# Patient Record
Sex: Female | Born: 1940 | Race: Black or African American | Hispanic: No | Smoking: Never smoker
Health system: Southern US, Community
[De-identification: ages and names within clinical notes are randomized; demographics above are authoritative.]

## PROBLEM LIST (undated history)

## (undated) DIAGNOSIS — G4733 Obstructive sleep apnea (adult) (pediatric): Secondary | ICD-10-CM

## (undated) DIAGNOSIS — R441 Visual hallucinations: Secondary | ICD-10-CM

## (undated) DIAGNOSIS — E119 Type 2 diabetes mellitus without complications: Secondary | ICD-10-CM

## (undated) DIAGNOSIS — I10 Essential (primary) hypertension: Secondary | ICD-10-CM

## (undated) DIAGNOSIS — E7800 Pure hypercholesterolemia, unspecified: Secondary | ICD-10-CM

## (undated) DIAGNOSIS — M159 Polyosteoarthritis, unspecified: Secondary | ICD-10-CM

## (undated) DIAGNOSIS — R2689 Other abnormalities of gait and mobility: Secondary | ICD-10-CM

## (undated) DIAGNOSIS — R443 Hallucinations, unspecified: Secondary | ICD-10-CM

## (undated) DIAGNOSIS — E079 Disorder of thyroid, unspecified: Secondary | ICD-10-CM

## (undated) DIAGNOSIS — I509 Heart failure, unspecified: Secondary | ICD-10-CM

## (undated) DIAGNOSIS — E2839 Other primary ovarian failure: Secondary | ICD-10-CM

## (undated) DIAGNOSIS — R400 Somnolence: Secondary | ICD-10-CM

## (undated) HISTORY — DX: Other abnormalities of gait and mobility: R26.89

## (undated) HISTORY — DX: Obstructive sleep apnea (adult) (pediatric): G47.33

## (undated) HISTORY — PX: HERNIA REPAIR: SHX51

## (undated) HISTORY — DX: Visual hallucinations: R44.1

## (undated) HISTORY — DX: Heart failure, unspecified: I50.9

## (undated) HISTORY — PX: OTHER SURGICAL HISTORY: SHX169

## (undated) HISTORY — DX: Somnolence: R40.0

## (undated) HISTORY — DX: Other primary ovarian failure: E28.39

## (undated) HISTORY — DX: Pure hypercholesterolemia, unspecified: E78.00

## (undated) HISTORY — DX: Polyosteoarthritis, unspecified: M15.9

---

## 2012-10-22 ENCOUNTER — Emergency Department (HOSPITAL_COMMUNITY)
Admission: EM | Admit: 2012-10-22 | Discharge: 2012-10-22 | Disposition: A | Discharge status: Home / Self Care | Payer: Medicare Other | Attending: Emergency Medicine | Admitting: Emergency Medicine

## 2012-10-22 ENCOUNTER — Emergency Department (HOSPITAL_COMMUNITY): Payer: Medicare Other

## 2012-10-22 ENCOUNTER — Encounter (HOSPITAL_COMMUNITY): Payer: Self-pay | Admitting: *Deleted

## 2012-10-22 DIAGNOSIS — E669 Obesity, unspecified: Secondary | ICD-10-CM | POA: Insufficient documentation

## 2012-10-22 DIAGNOSIS — Z7982 Long term (current) use of aspirin: Secondary | ICD-10-CM | POA: Insufficient documentation

## 2012-10-22 DIAGNOSIS — R062 Wheezing: Secondary | ICD-10-CM | POA: Insufficient documentation

## 2012-10-22 DIAGNOSIS — I10 Essential (primary) hypertension: Secondary | ICD-10-CM | POA: Insufficient documentation

## 2012-10-22 DIAGNOSIS — Z79899 Other long term (current) drug therapy: Secondary | ICD-10-CM | POA: Insufficient documentation

## 2012-10-22 DIAGNOSIS — Z8639 Personal history of other endocrine, nutritional and metabolic disease: Secondary | ICD-10-CM | POA: Insufficient documentation

## 2012-10-22 DIAGNOSIS — R05 Cough: Secondary | ICD-10-CM | POA: Insufficient documentation

## 2012-10-22 DIAGNOSIS — Z862 Personal history of diseases of the blood and blood-forming organs and certain disorders involving the immune mechanism: Secondary | ICD-10-CM | POA: Insufficient documentation

## 2012-10-22 DIAGNOSIS — Z794 Long term (current) use of insulin: Secondary | ICD-10-CM | POA: Insufficient documentation

## 2012-10-22 DIAGNOSIS — E7800 Pure hypercholesterolemia, unspecified: Secondary | ICD-10-CM | POA: Insufficient documentation

## 2012-10-22 DIAGNOSIS — R059 Cough, unspecified: Secondary | ICD-10-CM | POA: Insufficient documentation

## 2012-10-22 DIAGNOSIS — J9801 Acute bronchospasm: Secondary | ICD-10-CM | POA: Insufficient documentation

## 2012-10-22 DIAGNOSIS — R0602 Shortness of breath: Secondary | ICD-10-CM | POA: Insufficient documentation

## 2012-10-22 DIAGNOSIS — E119 Type 2 diabetes mellitus without complications: Secondary | ICD-10-CM | POA: Insufficient documentation

## 2012-10-22 HISTORY — DX: Pure hypercholesterolemia, unspecified: E78.00

## 2012-10-22 HISTORY — DX: Disorder of thyroid, unspecified: E07.9

## 2012-10-22 HISTORY — DX: Essential (primary) hypertension: I10

## 2012-10-22 HISTORY — DX: Type 2 diabetes mellitus without complications: E11.9

## 2012-10-22 LAB — CBC
MCV: 78.8 fL (ref 78.0–100.0)
Platelets: 254 10*3/uL (ref 150–400)
RBC: 4.81 MIL/uL (ref 3.87–5.11)
RDW: 15 % (ref 11.5–15.5)
WBC: 7 10*3/uL (ref 4.0–10.5)

## 2012-10-22 LAB — BASIC METABOLIC PANEL
Chloride: 99 mEq/L (ref 96–112)
Creatinine, Ser: 0.79 mg/dL (ref 0.50–1.10)
GFR calc Af Amer: >90 mL/min (ref >90)
Potassium: 3.7 mEq/L (ref 3.5–5.1)
Sodium: 136 mEq/L (ref 135–145)

## 2012-10-22 LAB — POCT I-STAT TROPONIN I: Troponin i, poc: 0 ng/mL (ref 0.00–0.08)

## 2012-10-22 LAB — PRO B NATRIURETIC PEPTIDE: Pro B Natriuretic peptide (BNP): 92.7 pg/mL (ref 0–125)

## 2012-10-22 MED ORDER — ALBUTEROL SULFATE (5 MG/ML) 0.5% IN NEBU
5.0000 mg | INHALATION_SOLUTION | Freq: Once | RESPIRATORY_TRACT | Status: AC
  Administered 2012-10-22: 5 mg via RESPIRATORY_TRACT
  Filled 2012-10-22: qty 1

## 2012-10-22 MED ORDER — BENZONATATE 100 MG PO CAPS: 100.0000 mg | ORAL_CAPSULE | Freq: Three times a day (TID) | ORAL | Status: DC

## 2012-10-22 MED ORDER — ALBUTEROL SULFATE HFA 108 (90 BASE) MCG/ACT IN AERS
2.0000 | INHALATION_SPRAY | Freq: Once | RESPIRATORY_TRACT | Status: AC
  Administered 2012-10-22: 2 via RESPIRATORY_TRACT
  Filled 2012-10-22: qty 6.7

## 2012-10-22 NOTE — ED Notes (Signed)
MD at bedside. 

## 2012-10-22 NOTE — ED Provider Notes (Signed)
History     CSN: [id number]  Arrival date & time 10/22/12  1640   First MD Initiated Contact with Patient 10/22/12 1752      Chief Complaint  Patient presents with  . Cough    (Consider location/radiation/quality/duration/timing/severity/associated sxs/prior treatment) HPI Comments: 72 year old female with a past medical history of diabetes, hypertension, high cholesterol and thyroid disease presents daughter after being seen at [facility]. Patient went to urgent care complaining of a productive cough with yellow phlegm, wheezing and shortness of breath times one month. Nothing in specific changed today, however she is in town visiting her daughter and her daughter states this has been going on for long enough and she should be evaluated. Denies extremity edema, chest pain, fever, chills, nausea or vomiting. No history of heart failure. She sees her primary care physician on a regular basis and there've been no changes in her medications. At urgent care she was told on a chest x-ray that there was "fluid around her heart" and was advised to go to the emergency department.  Patient is a 72 y.o. female presenting with cough. The history is provided by the patient and a relative.  Cough Associated symptoms: shortness of breath and wheezing   Associated symptoms: no chest pain, no chills, no fever and no headaches     Past Medical History  Diagnosis Date  . Diabetes mellitus without complication   . Hypertension   . Thyroid disease   . High cholesterol     Past Surgical History  Procedure Laterality Date  . Hernia repair    . Gallstone      History reviewed. No pertinent family history.  History  Substance Use Topics  . Smoking status: Never Smoker   . Smokeless tobacco: Never Used  . Alcohol Use: No    OB History   Grav Para Term Preterm Abortions TAB SAB Ect Mult Living                  Review of Systems  Constitutional: Negative for fever and chills.   Respiratory: Positive for cough, shortness of breath and wheezing.   Cardiovascular: Negative for chest pain, palpitations and leg swelling.  Musculoskeletal: Negative for back pain.  Neurological: Negative for dizziness, weakness, light-headedness and headaches.  All other systems reviewed and are negative.    Allergies  Review of patient's allergies indicates no known allergies.  Home Medications   Current Outpatient Rx  Name  Route  Sig  Dispense  Refill  . aspirin 81 MG tablet   Oral   Take 81 mg by mouth daily.         . hydrochlorothiazide (MICROZIDE) 12.5 MG capsule   Oral   Take 12.5 mg by mouth daily.         Marland Kitchen ibuprofen (ADVIL,MOTRIN) 200 MG tablet   Oral   Take 200 mg by mouth every 6 (six) hours as needed for pain.         Marland Kitchen insulin glargine (LANTUS) 100 UNIT/ML injection   Subcutaneous   Inject into the skin at bedtime.         . metFORMIN (GLUMETZA) 1000 MG (MOD) 24 hr tablet   Oral   Take 1,000 mg by mouth daily with breakfast.         . pravastatin (PRAVACHOL) 40 MG tablet   Oral   Take 40 mg by mouth at bedtime.         . valsartan (DIOVAN) 160 MG  tablet   Oral   Take 160 mg by mouth daily.           BP 158/68  Pulse 71  Temp(Src) 98.4 F (36.9 C) (Oral)  Resp 20  SpO2 98%  Physical Exam  Nursing note and vitals reviewed. Constitutional: She is oriented to person, place, and time. She appears well-developed. No distress.  Obese  HENT:  Head: Normocephalic and atraumatic.  Mouth/Throat: Oropharynx is clear and moist.  Eyes: Conjunctivae are normal.  Neck: Normal range of motion. Neck supple.  Cardiovascular: Normal rate, regular rhythm, normal heart sounds and intact distal pulses.   Pulses:      Dorsalis pedis pulses are 2+ on the right side, and 2+ on the left side.       Posterior tibial pulses are 2+ on the right side, and 2+ on the left side.  Trace pitting edema lower extremities bilateral  Pulmonary/Chest: Effort  normal. No respiratory distress. She has no decreased breath sounds. She has no wheezes. She has no rhonchi. She has rales (posterior lung bases, mild).  Abdominal: Soft. Bowel sounds are normal. There is no tenderness.  Musculoskeletal: Normal range of motion. She exhibits no edema.  Neurological: She is alert and oriented to person, place, and time. She has normal strength. No cranial nerve deficit or sensory deficit. Gait normal.  Skin: Skin is warm and dry. She is not diaphoretic.  Psychiatric: She has a normal mood and affect. Her behavior is normal.    ED Course  Procedures (including critical care time)  Labs Reviewed  BASIC METABOLIC PANEL - Abnormal; Notable for the following:    Glucose, Bld 219 (*)    GFR calc non Af Amer 81 (*)    All other components within normal limits  CBC  PRO B NATRIURETIC PEPTIDE  POCT I-STAT TROPONIN I    Date: 10/22/2012  Rate: 65  Rhythm: sinus arrhythmia  QRS Axis: normal  Intervals: normal  ST/T Wave abnormalities: normal  Conduction Disutrbances:none  Narrative Interpretation: no stemi, aberrant complex, possibly supraventricular  Old EKG Reviewed: none available   Dg Chest 2 View  10/22/2012   *RADIOLOGY REPORT*  Clinical Data: Cough, hypertension  CHEST - 2 VIEW  Comparison: None.  Findings: Lung volumes are low with crowding of the bronchovascular markings.  Heart size is upper limits of normal.  No pleural effusion.  No acute osseous finding.  Mild leftward deviation of the trachea could reflect right thyroid nodule or goiter.  Left greater than right bilateral glenohumeral joint degenerative change identified. Cholecystectomy clips noted.  IMPRESSION: Extremely low lung volumes without focal acute finding. If the patient's symptoms continue, consider PA and lateral chest radiographs obtained at full inspiration when the patient is clinically able.   Original Report Authenticated By: Christiana Pellant, M.D.     1. Cough   2. Bronchospasm        MDM  72 year old female with a cough for one month with occasional shortness of breath and wheezing. She was advised to come to the ER with concerns of fluid around her heart. Her chest x-ray did not show any pulmonary edema. BNP within normal limits along with all the rest of her labs. Her EKG was normal. Symptoms resolved after having an albuterol breathing treatment. Repeat lung examination unremarkable. Patient doesn't have to history of GERD and is noncompliant with her medications. I discussed that she should probably start taking it again. Albuterol inhaler given along with for cough. Her vitals are  stable and she is in no apparent distress. O2 sat 98% on room air. Patient also evaluated by Dr. Juleen China feels as if patient is able to be discharged with follow up with her primary care physician. Return precautions discussed. Patient states understanding of plan and is agreeable.         Trevor Mace, PA-C 10/22/12 2020

## 2012-10-22 NOTE — ED Notes (Signed)
Pt states was sent here from [facility] d/t fluid build up around heart, states went there for cough and wheezing x 1 month, pt states sometimes has shortness of breath.

## 2012-10-23 NOTE — ED Provider Notes (Signed)
Medical screening examination/treatment/procedure(s) were conducted as a shared visit with non-physician practitioner(s) and myself.  I personally evaluated the patient during the encounter.  72 year old female with a cough for the past week. Etiology is not completely clear.  She had an x-ray at an outside facility which showed "fluid around her heart.". Imaging in the emergency room today distention or any evidence, although a suboptimal film. Her BNP is normal making heart failure extremely unlikely. She is in no respiratory distress on exam. Patient does have a history of GERD and is only intermittently taking her prescribed medication for this. Patient instructed the importance of taking this on a regular basis as reflux sometimes reason for chronic cough. We'll give her inhaler for possible symptomatic relief as well as antitussives. Does not seem to be consistent with asthma or postnasal drip. Return precautions discussed. Outpatient followup.  Raeford Razor, MD 10/23/12 1540

## 2015-05-06 ENCOUNTER — Emergency Department (HOSPITAL_COMMUNITY)
Admission: EM | Admit: 2015-05-06 | Discharge: 2015-05-06 | Disposition: A | Discharge status: Home / Self Care | Payer: Medicare Other | Attending: Emergency Medicine | Admitting: Emergency Medicine

## 2015-05-06 ENCOUNTER — Emergency Department (HOSPITAL_COMMUNITY): Payer: Medicare Other

## 2015-05-06 ENCOUNTER — Encounter (HOSPITAL_COMMUNITY): Payer: Self-pay | Admitting: Emergency Medicine

## 2015-05-06 DIAGNOSIS — Z7982 Long term (current) use of aspirin: Secondary | ICD-10-CM | POA: Diagnosis not present

## 2015-05-06 DIAGNOSIS — E119 Type 2 diabetes mellitus without complications: Secondary | ICD-10-CM | POA: Insufficient documentation

## 2015-05-06 DIAGNOSIS — M5442 Lumbago with sciatica, left side: Secondary | ICD-10-CM | POA: Diagnosis not present

## 2015-05-06 DIAGNOSIS — G8929 Other chronic pain: Secondary | ICD-10-CM | POA: Diagnosis not present

## 2015-05-06 DIAGNOSIS — I10 Essential (primary) hypertension: Secondary | ICD-10-CM | POA: Insufficient documentation

## 2015-05-06 DIAGNOSIS — Z7952 Long term (current) use of systemic steroids: Secondary | ICD-10-CM | POA: Diagnosis not present

## 2015-05-06 DIAGNOSIS — E7800 Pure hypercholesterolemia, unspecified: Secondary | ICD-10-CM | POA: Insufficient documentation

## 2015-05-06 DIAGNOSIS — R52 Pain, unspecified: Secondary | ICD-10-CM

## 2015-05-06 DIAGNOSIS — M545 Low back pain: Secondary | ICD-10-CM | POA: Diagnosis present

## 2015-05-06 DIAGNOSIS — Z791 Long term (current) use of non-steroidal anti-inflammatories (NSAID): Secondary | ICD-10-CM | POA: Diagnosis not present

## 2015-05-06 DIAGNOSIS — Z79899 Other long term (current) drug therapy: Secondary | ICD-10-CM | POA: Diagnosis not present

## 2015-05-06 MED ORDER — DEXAMETHASONE 4 MG PO TABS
10.0000 mg | ORAL_TABLET | Freq: Once | ORAL | Status: AC
  Administered 2015-05-06: 10 mg via ORAL
  Filled 2015-05-06: qty 2

## 2015-05-06 MED ORDER — ACETAMINOPHEN 500 MG PO TABS
1000.0000 mg | ORAL_TABLET | Freq: Once | ORAL | Status: AC
  Administered 2015-05-06: 1000 mg via ORAL
  Filled 2015-05-06: qty 2

## 2015-05-06 MED ORDER — DICLOFENAC SODIUM 75 MG PO TBEC
75.0000 mg | DELAYED_RELEASE_TABLET | Freq: Once | ORAL | Status: AC
  Administered 2015-05-06: 75 mg via ORAL
  Filled 2015-05-06: qty 1

## 2015-05-06 NOTE — ED Notes (Signed)
Patient transported to X-ray 

## 2015-05-06 NOTE — ED Provider Notes (Signed)
CSN: [id number]     Arrival date & time 05/06/15  1018 History   First MD Initiated Contact with Patient 05/06/15 1042     Chief Complaint  Patient presents with  . Sciatica     (Consider location/radiation/quality/duration/timing/severity/associated sxs/prior Treatment) Patient is a 74 y.o. female presenting with back pain. The history is provided by the patient.  Back Pain Location:  Lumbar spine Quality:  Stabbing and stiffness Radiates to:  L foot Pain severity:  Severe Pain is:  Same all the time Onset quality:  Sudden Duration:  2 days Timing:  Constant Progression:  Worsening Chronicity:  Chronic Relieved by:  Nothing Worsened by:  Twisting, touching and movement Ineffective treatments:  None tried Associated symptoms: no chest pain, no dysuria, no fever and no headaches    74 yo F with a chief complaints of left-sided lower back pain. Patient has chronic issue of this though is worsening over the past couple days. Patient's daughter recently had a child and she has been helping out with that. Pain worse with movement palpation. States that the pain goes down the back of her legs were feet. Denies loss of bowel or bladder. Denies injury. Denies fevers or chills.   Past Medical History  Diagnosis Date  . Diabetes mellitus without complication (HCC)   . Hypertension   . Thyroid disease   . High cholesterol    Past Surgical History  Procedure Laterality Date  . Hernia repair    . Gallstone     History reviewed. No pertinent family history. Social History  Substance Use Topics  . Smoking status: Never Smoker   . Smokeless tobacco: Never Used  . Alcohol Use: No   OB History    No data available     Review of Systems  Constitutional: Negative for fever and chills.  HENT: Negative for congestion and rhinorrhea.   Eyes: Negative for redness and visual disturbance.  Respiratory: Negative for shortness of breath and wheezing.   Cardiovascular: Negative for  chest pain and palpitations.  Gastrointestinal: Negative for nausea and vomiting.  Genitourinary: Negative for dysuria and urgency.  Musculoskeletal: Positive for back pain and arthralgias. Negative for myalgias.  Skin: Negative for pallor and wound.  Neurological: Negative for dizziness and headaches.      Allergies  Review of patient's allergies indicates no known allergies.  Home Medications   Prior to Admission medications   Medication Sig Start Date End Date Taking? Authorizing Provider  aspirin 81 MG tablet Take 81 mg by mouth daily.   Yes Historical Provider, MD  diclofenac (VOLTAREN) 75 MG EC tablet Take 75 mg by mouth 2 (two) times daily.   Yes Historical Provider, MD  diclofenac sodium (VOLTAREN) 1 % GEL Apply 2 g topically 4 (four) times daily.   Yes Historical Provider, MD  dorzolamide-timolol (COSOPT) 22.3-6.8 MG/ML ophthalmic solution Place 1 drop into both eyes 2 (two) times daily. 04/25/15  Yes Historical Provider, MD  furosemide (LASIX) 40 MG tablet Take 1 tablet by mouth daily as needed for fluid.  03/14/15  Yes Historical Provider, MD  hydrochlorothiazide (MICROZIDE) 12.5 MG capsule Take 12.5 mg by mouth daily.   Yes Historical Provider, MD  ibuprofen (ADVIL,MOTRIN) 200 MG tablet Take 200 mg by mouth every 6 (six) hours as needed for pain.   Yes Historical Provider, MD  LEVEMIR FLEXTOUCH 100 UNIT/ML Pen Inject 40 Units into the skin 2 (two) times daily. 03/18/15  Yes Historical Provider, MD  losartan (COZAAR) 50 MG tablet  Take 1 tablet by mouth daily. 02/26/15  Yes Historical Provider, MD  LUMIGAN 0.01 % SOLN Place 1 drop into both eyes daily. 04/25/15  Yes Historical Provider, MD  metFORMIN (GLUMETZA) 1000 MG (MOD) 24 hr tablet Take 1,000 mg by mouth 2 (two) times daily.    Yes Historical Provider, MD  metoprolol (LOPRESSOR) 100 MG tablet Take 100 mg by mouth 2 (two) times daily.   Yes Historical Provider, MD  Multiple Vitamin (MULTIVITAMIN WITH MINERALS) TABS tablet  Take 1 tablet by mouth daily.   Yes Historical Provider, MD  pravastatin (PRAVACHOL) 40 MG tablet Take 40 mg by mouth at bedtime.   Yes Historical Provider, MD  triamcinolone ointment (KENALOG) 0.1 % Apply 1 application topically 3 (three) times daily. 02/23/15  Yes Historical Provider, MD  benzonatate (TESSALON) 100 MG capsule Take 1 capsule (100 mg total) by mouth every 8 (eight) hours. Patient not taking: Reported on 05/06/2015 10/22/12   Nada Boozer Hess, PA-C   BP 146/57 mmHg  Pulse 59  Temp(Src) 97.7 F (36.5 C) (Oral)  Resp 18  SpO2 98% Physical Exam  Constitutional: She is oriented to person, place, and time. She appears well-developed and well-nourished. No distress.  HENT:  Head: Normocephalic and atraumatic.  Eyes: EOM are normal. Pupils are equal, round, and reactive to light.  Neck: Normal range of motion. Neck supple.  Cardiovascular: Normal rate and regular rhythm.  Exam reveals no gallop and no friction rub.   No murmur heard. Pulmonary/Chest: Effort normal. She has no wheezes. She has no rales.  Abdominal: Soft. She exhibits no distension. There is no tenderness. There is no rebound and no guarding.  Musculoskeletal: She exhibits tenderness (mild tenderness about the lower L-spine. Negative slr test. ). She exhibits no edema.  Neurological: She is alert and oriented to person, place, and time.  Skin: Skin is warm and dry. She is not diaphoretic.  Psychiatric: She has a normal mood and affect. Her behavior is normal.  Nursing note and vitals reviewed.   ED Course  Procedures (including critical care time) Labs Review Labs Reviewed - No data to display  Imaging Review Dg Lumbar Spine Complete  05/06/2015  CLINICAL DATA:  Left lower extremity radiculitis.  Sciatica. EXAM: LUMBAR SPINE - COMPLETE 4+ VIEW COMPARISON:  None. FINDINGS: Five non rib-bearing lumbar type vertebral bodies are present. Endplate degenerative change and spurring is present at L4-5 and L5-S1. Slight  degenerative anterolisthesis is present at L4-5. Advanced facet degenerative changes are present at L3-4 and particularly at L4-5 and L5-S1. Vertebral body heights are maintained. 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 IMPRESSION: 1. 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 2. No acute abnormality. 3. Atherosclerosis. Electronically Signed   By: Marin Roberts M.D.   On: 05/06/2015 12:08   I have personally reviewed and evaluated these images and lab results as part of my medical decision-making.   EKG Interpretation None      MDM   Final diagnoses:  Chronic left-sided low back pain with left-sided sciatica    74 yo F with a chief complaint of left-sided low back pain. Is a chronic problem for this patient. X-ray performed due to patient's age. Unremarkable. We'll discharge the patient home. NSAIDs for pain.  3:48 PM:  I have discussed the diagnosis/risks/treatment options with the patient and believe the pt to be eligible for discharge home to follow-up with PCP. We also discussed returning  to the ED immediately if new or worsening sx occur. We discussed the sx which are most concerning (e.g., sudden worsening pain, fever, cauda equina) that necessitate immediate return. Medications administered to the patient during their visit and any new prescriptions provided to the patient are listed below.  Medications given during this visit Medications  acetaminophen (TYLENOL) tablet 1,000 mg (1,000 mg Oral Given 05/06/15 1128)  diclofenac (VOLTAREN) EC tablet 75 mg (75 mg Oral Given 05/06/15 1128)  dexamethasone (DECADRON) tablet 10 mg (10 mg Oral Given 05/06/15 1241)    Discharge Medication List as of 05/06/2015 12:28 PM      The patient appears reasonably screen and/or stabilized for discharge and I doubt any other medical condition or other [facility] requiring further screening, evaluation, or  treatment in the ED at this time prior to discharge.      Melene Planan Lexander Tremblay, DO 05/06/15 [phone]

## 2015-05-06 NOTE — Discharge Instructions (Signed)
Follow-up with your doctor. Return for sudden worsening pain difficulty controlling her bladder or bowels or weakness of your legs. Chronic Back Pain  When back pain lasts longer than 3 months, it is called chronic back pain.People with chronic back pain often go through certain periods that are more intense (flare-ups).  CAUSES Chronic back pain can be caused by wear and tear (degeneration) on different structures in your back. These structures include:  The bones of your spine (vertebrae) and the joints surrounding your spinal cord and nerve roots (facets).  The strong, fibrous tissues that connect your vertebrae (ligaments). Degeneration of these structures may result in pressure on your nerves. This can lead to constant pain. HOME CARE INSTRUCTIONS  Avoid bending, heavy lifting, prolonged sitting, and activities which make the problem worse.  Take brief periods of rest throughout the day to reduce your pain. Lying down or standing usually is better than sitting while you are resting.  Take over-the-counter or prescription medicines only as directed by your caregiver. SEEK IMMEDIATE MEDICAL CARE IF:   You have weakness or numbness in one of your legs or feet.  You have trouble controlling your bladder or bowels.  You have nausea, vomiting, abdominal pain, shortness of breath, or fainting.   This information is not intended to replace advice given to you by your health care provider. Make sure you discuss any questions you have with your health care provider.   Document Released: 06/17/2004 Document Revised: 08/02/2011 Document Reviewed: 10/28/2014 Elsevier Interactive Patient Education [employer]2016 [employer]

## 2015-05-06 NOTE — ED Notes (Addendum)
Pt c/o sciatic pain in left hip/leg. Has been diagnosed with this and takes pain medication and gels which have not been working. Says she has taken Percocet in the past and it "works a little." Ambulatory with slow gait. PCP told patient she needed to have "the shot (unsure of name)." No other c/c. No hx DVT.

## 2019-07-24 DIAGNOSIS — G8929 Other chronic pain: Secondary | ICD-10-CM

## 2019-07-24 DIAGNOSIS — I10 Essential (primary) hypertension: Secondary | ICD-10-CM

## 2019-07-24 DIAGNOSIS — D649 Anemia, unspecified: Secondary | ICD-10-CM | POA: Insufficient documentation

## 2019-07-24 DIAGNOSIS — R0602 Shortness of breath: Secondary | ICD-10-CM

## 2019-07-24 HISTORY — DX: Essential (primary) hypertension: I10

## 2019-07-24 HISTORY — DX: Other chronic pain: G89.29

## 2019-07-24 HISTORY — DX: Shortness of breath: R06.02

## 2019-07-24 HISTORY — DX: Anemia, unspecified: D64.9

## 2019-11-13 ENCOUNTER — Encounter (HOSPITAL_COMMUNITY): Payer: Self-pay | Admitting: Emergency Medicine

## 2019-11-13 ENCOUNTER — Emergency Department (HOSPITAL_COMMUNITY)
Admission: EM | Admit: 2019-11-13 | Discharge: 2019-11-15 | Disposition: A | Discharge status: Home / Self Care | Payer: Medicare Other | Attending: Emergency Medicine | Admitting: Emergency Medicine

## 2019-11-13 ENCOUNTER — Other Ambulatory Visit: Payer: Self-pay

## 2019-11-13 DIAGNOSIS — R441 Visual hallucinations: Secondary | ICD-10-CM | POA: Insufficient documentation

## 2019-11-13 DIAGNOSIS — F22 Delusional disorders: Secondary | ICD-10-CM | POA: Diagnosis not present

## 2019-11-13 DIAGNOSIS — R440 Auditory hallucinations: Secondary | ICD-10-CM | POA: Insufficient documentation

## 2019-11-13 LAB — RAPID URINE DRUG SCREEN, HOSP PERFORMED
Amphetamines: NOT DETECTED
Barbiturates: NOT DETECTED
Benzodiazepines: NOT DETECTED
Cocaine: NOT DETECTED
Opiates: NOT DETECTED
Tetrahydrocannabinol: NOT DETECTED

## 2019-11-13 LAB — COMPREHENSIVE METABOLIC PANEL
ALT: 10 U/L (ref 0–44)
AST: 17 U/L (ref 15–41)
Albumin: 3.3 g/dL — ABNORMAL LOW (ref 3.5–5.0)
Alkaline Phosphatase: 96 U/L (ref 38–126)
Anion gap: 12 (ref 5–15)
BUN: 15 mg/dL (ref 8–23)
CO2: 23 mmol/L (ref 22–32)
Calcium: 9.3 mg/dL (ref 8.9–10.3)
Chloride: 105 mmol/L (ref 98–111)
Creatinine, Ser: 1.45 mg/dL — ABNORMAL HIGH (ref 0.44–1.00)
GFR calc Af Amer: 40 mL/min — ABNORMAL LOW (ref >60)
GFR calc non Af Amer: 34 mL/min — ABNORMAL LOW (ref >60)
Glucose, Bld: 138 mg/dL — ABNORMAL HIGH (ref 70–99)
Potassium: 3.5 mmol/L (ref 3.5–5.1)
Sodium: 140 mmol/L (ref 135–145)
Total Bilirubin: 0.6 mg/dL (ref 0.3–1.2)
Total Protein: 7.1 g/dL (ref 6.5–8.1)

## 2019-11-13 LAB — CBC
HCT: 36.9 % (ref 36.0–46.0)
Hemoglobin: 11.5 g/dL — ABNORMAL LOW (ref 12.0–15.0)
MCH: 25.5 pg — ABNORMAL LOW (ref 26.0–34.0)
MCHC: 31.2 g/dL (ref 30.0–36.0)
MCV: 81.8 fL (ref 80.0–100.0)
Platelets: 221 10*3/uL (ref 150–400)
RBC: 4.51 MIL/uL (ref 3.87–5.11)
RDW: 18.4 % — ABNORMAL HIGH (ref 11.5–15.5)
WBC: 5.6 10*3/uL (ref 4.0–10.5)
nRBC: 0 % (ref 0.0–0.2)

## 2019-11-13 LAB — URINALYSIS, ROUTINE W REFLEX MICROSCOPIC
Bilirubin Urine: NEGATIVE
Glucose, UA: NEGATIVE mg/dL
Hgb urine dipstick: NEGATIVE
Ketones, ur: NEGATIVE mg/dL
Leukocytes,Ua: NEGATIVE
Nitrite: NEGATIVE
Protein, ur: NEGATIVE mg/dL
Specific Gravity, Urine: 1.01 (ref 1.005–1.030)
pH: 5 (ref 5.0–8.0)

## 2019-11-13 NOTE — ED Triage Notes (Signed)
Patient arrives to ED by her daughter with complaints of visual hallucinations over the past few months. Patient states lately she has been seeing more and more. (kids, snakes, dead people who arent there). Denies SI/HI and denies hearing voices. States she just wants to be medicated because her PCP has not helped her.

## 2019-11-13 NOTE — ED Notes (Signed)
Christy Hanson NT informed this RN that pt was refusing to change into purple scrubs. Pt stated that wearing the scrubs meant she was staying the night and she did not want to.

## 2019-11-13 NOTE — ED Provider Notes (Signed)
[facility] EMERGENCY DEPARTMENT Provider Note   CSN: [id number] Arrival date & time: 11/13/19  1434     History Chief Complaint  Patient presents with  . Hallucinations  . Paranoid    Christy Hanson is a 79 y.o. female with history of diabetes mellitus, hypertension, hyperlipidemia, thyroid disease presents for evaluation of acute onset, progressively worsening visual hallucinations.  She reports that she has been experiencing hallucinations for "years" but they have significantly worsened more recently.  She states that she will typically see snakes or bugs/worms but now she is seeing people and animals.  She states that these hallucinations cause her great discomfort.  She also has occasional auditory hallucinations, hearing the ground crack.  She denies homicidal or suicidal ideation.  Denies recreational drug use or excessive alcohol intake.  Patient's daughter is at the bedside and provides supplemental history.  She states that the patient has been living with her for some time but 2 weeks ago moved into her own apartment.  The daughter notes that the hallucinations significantly worsened after that time and where "previously she knew the hallucinations were not real now she thinks they are real".  She notes the patient has been more anxious and she is afraid the patient has not been getting as much sleep.  She states that the other day the patient locked herself out of her apartment after thinking that she saw a snake.  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  She is primarily interested in starting medication to stop the hallucinations because they are causing her a great deal of distress.  The history is provided by the patient.       Past Medical History:  Diagnosis Date  . Diabetes mellitus without complication (HCC)   . High cholesterol    . Hypertension   . Thyroid disease     There are no problems to display for this patient.   Past Surgical History:  Procedure Laterality Date  . gallstone    . HERNIA REPAIR       OB History   No obstetric history on file.     History reviewed. No pertinent family history.  Social History   Tobacco Use  . Smoking status: Never Smoker  . Smokeless tobacco: Never Used  Substance Use Topics  . Alcohol use: No  . Drug use: No    Home Medications Prior to Admission medications   Medication Sig Start Date End Date Taking? Authorizing Provider  aspirin 81 MG tablet Take 81 mg by mouth daily.    [provider]  benzonatate (TESSALON) 100 MG capsule Take 1 capsule (100 mg total) by mouth every 8 (eight) hours. Patient not taking: Reported on 05/06/2015 10/22/12   Hess, Nada Boozer, PA-C  diclofenac (VOLTAREN) 75 MG EC tablet Take 75 mg by mouth 2 (two) times daily.    [provider]  diclofenac sodium (VOLTAREN) 1 % GEL Apply 2 g topically 4 (four) times daily.    [provider]  dorzolamide-timolol (COSOPT) 22.3-6.8 MG/ML ophthalmic solution Place 1 drop into both eyes 2 (two) times daily. 04/25/15   [provider]  furosemide (LASIX) 40 MG tablet Take 1 tablet by mouth daily as needed for fluid.  03/14/15   [provider]  hydrochlorothiazide (MICROZIDE) 12.5 MG capsule Take 12.5 mg by mouth daily.  [provider]  ibuprofen (ADVIL,MOTRIN) 200 MG tablet Take 200 mg by mouth every 6 (six) hours as needed for pain.    [provider]  LEVEMIR FLEXTOUCH 100 UNIT/ML Pen Inject 40 Units into the skin 2 (two) times daily. 03/18/15   [provider]  losartan (COZAAR) 50 MG tablet Take 1 tablet by mouth daily. 02/26/15   [provider]  LUMIGAN 0.01 % SOLN Place 1 drop into both eyes daily. 04/25/15   [provider]  metFORMIN (GLUMETZA) 1000 MG (MOD) 24 hr tablet Take 1,000 mg by mouth 2 (two)  times daily.     [provider]  metoprolol (LOPRESSOR) 100 MG tablet Take 100 mg by mouth 2 (two) times daily.    [provider]  Multiple Vitamin (MULTIVITAMIN WITH MINERALS) TABS tablet Take 1 tablet by mouth daily.    [provider]  pravastatin (PRAVACHOL) 40 MG tablet Take 40 mg by mouth at bedtime.    [provider]  triamcinolone ointment (KENALOG) 0.1 % Apply 1 application topically 3 (three) times daily. 02/23/15   [provider]    Allergies    Patient has no known allergies.  Review of Systems   Review of Systems  Constitutional: Negative for chills and fever.  Eyes: Negative for visual disturbance.  Respiratory: Negative for shortness of breath.   Cardiovascular: Negative for chest pain.  Gastrointestinal: Negative for abdominal pain, nausea and vomiting.  Neurological: Negative for headaches.  Psychiatric/Behavioral: Positive for hallucinations. The patient is nervous/anxious.   All other systems reviewed and are negative.   Physical Exam Updated Vital Signs BP 139/83 (BP Location: Left Arm)   Pulse 91   Temp 98.5 F (36.9 C) (Oral)   Resp 18   Ht 5\' 2"  (1.575 m)   Wt 96.6 kg   SpO2 100%   BMI 38.96 kg/m   Physical Exam Vitals and nursing note reviewed.  Constitutional:      General: She is not in acute distress.    Appearance: She is well-developed.  HENT:     Head: Normocephalic and atraumatic.  Eyes:     General:        Right eye: No discharge.        Left eye: No discharge.     Conjunctiva/sclera: Conjunctivae normal.  Neck:     Vascular: No JVD.     Trachea: No tracheal deviation.  Cardiovascular:     Rate and Rhythm: Normal rate and regular rhythm.  Pulmonary:     Effort: Pulmonary effort is normal.     Breath sounds: Normal breath sounds.  Abdominal:     General: There is no distension.     Palpations: Abdomen is soft.  Musculoskeletal:     Cervical back: Neck supple.  Skin:    Findings:  No erythema.  Neurological:     Mental Status: She is alert.  Psychiatric:        Attention and Perception: Attention normal. She perceives auditory and visual hallucinations.        Mood and Affect: Mood normal.        Speech: Speech normal.        Behavior: Behavior is cooperative.        Thought Content: Thought content does not include homicidal or suicidal ideation. Thought content does not include homicidal or suicidal plan.     ED Results / Procedures / Treatments   Labs (all labs ordered are listed, but only abnormal results  are displayed) Labs Reviewed  COMPREHENSIVE METABOLIC PANEL - Abnormal; Notable for the following components:      Result Value   Glucose, Bld 138 (*)    Creatinine, Ser 1.45 (*)    Albumin 3.3 (*)    GFR calc non Af Amer 34 (*)    GFR calc Af Amer 40 (*)    All other components within normal limits  CBC - Abnormal; Notable for the following components:   Hemoglobin 11.5 (*)    MCH 25.5 (*)    RDW 18.4 (*)    All other components within normal limits  RAPID URINE DRUG SCREEN, HOSP PERFORMED  URINALYSIS, ROUTINE W REFLEX MICROSCOPIC    EKG None  Radiology No results found.  Procedures Procedures (including critical care time)  Medications Ordered in ED Medications - No data to display  ED Course  I have reviewed the triage vital signs and the nursing notes.  Pertinent labs & imaging results that were available during my care of the patient were reviewed by me and considered in my medical decision making (see chart for details).    MDM Rules/Calculators/A&P                          Patient presents for evaluation of visual hallucinations.  Also has auditory hallucinations but reports the visual hallucinations are very bothersome to her and cause her distress.  Daughter reports hallucinations are getting worse since moving out on her own.  When directly asked if she feels that the patient is unsafe in her home she states that she does  not feel that the patient is unsafe but is concerned about the worsening symptoms and potentially lack of sleep as well.  They are both open to the patient trying a medication for her symptoms.  She is afebrile, vital signs are stable.  She is nontoxic in appearance.  She does not appear to be responding to internal stimuli at this time.  She is calm and cooperative with my assessment.  Physical examination is reassuring and screening labs reviewed and interpreted by myself show no leukocytosis, mild anemia, mildly elevated creatinine but BUN within normal limits.  UA is not concerning for UTI.  She is medically cleared for TTS evaluation at this time.  Care signed out to default provider for follow-up of TTS recommendations.  Final Clinical Impression(s) / ED Diagnoses Final diagnoses:  Visual hallucinations  Auditory hallucination    Rx / DC Orders ED Discharge Orders    None       Debroah Baller 11/13/19 2321    Drenda Freeze, MD 11/13/19 [phone]

## 2019-11-13 NOTE — ED Notes (Signed)
Attempted to change the pt into purple scrubs, the pt refused and stated that she is not spending the night. RN notified

## 2019-11-13 NOTE — ED Notes (Signed)
Kennyth Lose, daughter, [phone] would like an update when available

## 2019-11-14 ENCOUNTER — Emergency Department (HOSPITAL_COMMUNITY): Payer: Medicare Other

## 2019-11-14 DIAGNOSIS — R441 Visual hallucinations: Secondary | ICD-10-CM | POA: Diagnosis not present

## 2019-11-14 MED ORDER — QUETIAPINE FUMARATE 50 MG PO TABS: 50.0000 mg | ORAL_TABLET | Freq: Every day | ORAL | Status: DC

## 2019-11-14 MED ORDER — QUETIAPINE FUMARATE 50 MG PO TABS
50.0000 mg | ORAL_TABLET | Freq: Every day | ORAL | 0 refills | Status: DC
Start: 2019-11-14 — End: 2020-01-25

## 2019-11-14 MED ORDER — METFORMIN HCL 500 MG PO TABS: 1000.0000 mg | ORAL_TABLET | Freq: Once | ORAL | Status: DC

## 2019-11-14 NOTE — ED Notes (Signed)
Pt's breakfast arrived 

## 2019-11-14 NOTE — ED Notes (Addendum)
Pt's dtr called and reports in 2019 pt had back surgery. She refused to do any therapy- she was scared of nursing home. She did go but then came home and just sat around. She does not like to be active or move at all. She has been staying with dtr during COVID. She took her back to [facility] in January due to pt's continued request. While there GI bleeding occurred and she went to hospital. Dtr stayed with her during this and they had already talked about moving. She grew concerned that mother would not actually move to [place]. She began to pack her up.  Before she moved, she had been seeing things all the time. SHe was very calm about it and was able to say what she was seeing. She would ask her dtr to come and ask her what is on fireplace. She also reports that she sees her dead husband but without a head. She sees his body parts like his hand. She would see snake there. Dtr said these behaviors were new to her. When she moved the behaviors continued per dtr.  Things came to a head when pt wanted dtr to call police bc she saw someone in her room with face covering on. She told pt she either had to come to hospital voluntarily and involuntarily but had to come to hospital to get help.  Dtr denies hx of substance abuse. Reports her memory seems fine. She is able to do ADLs so she has delayed seeking diagnosis for while.

## 2019-11-14 NOTE — ED Notes (Signed)
dtr to bring phone charger

## 2019-11-14 NOTE — ED Notes (Signed)
Patient transported to CT 

## 2019-11-14 NOTE — Progress Notes (Signed)
Reassessment: Patient seen. Chart reviewed. Christy Hanson is a 79 year old female with history of diabetes, HTN, HLD, and thyroid disease who presented to the ED due to visual hallucinations. She has no psychiatric history.  On assessment today, Christy Hanson seen sitting calmly in bed. She is alert and oriented x3. She appears euthymic and reports stable mood. She shows no signs of responding to internal stimuli. She reports she has been having visual hallucinations for about three years. 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   Patient reports VH have increased in intensity over the last two weeks since she moved into a new apartment. She called the police at one point due to seeing mice crawling out of the ceiling, but when police arrived there was nothing there. She sees animals as well as family members who are not there, including her deceased husband. She states VH were not bothersome initially, but now she feels afraid of them at times. She denies auditory hallucinations. She denies depressed mood or mood instability. She does admit to anxiety related to the increased visual hallucinations. She strongly denies any suicidal or self-harm thoughts. Denies HI. She does report problems with sleep and states that the visual hallucinations are most common right before she goes to sleep and when she awakens.  She denies history of hallucinations before the last three years. Denies history of mental health diagnoses or hospitalizations. No history of suicide attempts or aggression.  With patient's verbal consent, collateral information was obtained from her daughter Christy Hanson. [name] reports concern because the patient had always realized her VH were not real, but over the last two weeks patient seems to believe hallucinations are real at times and has become distressed as a result. She denies observing any  signs of memory loss in the patient. She denies any other psychiatric symptoms. She denies concerns that the patient is an acute risk of harm to self or others.  Disposition: Patient is not acutely psychotic and poses no acute risk of harm to self or others. She is psychiatrically cleared for discharge. ED RN notified. I have consulted with EDP about concern for potential medical cause of visual hallucinations, and CT scan of head is ordered. Will start Seroquel 50 mg QHS to assist with sleep problems and hallucinations. Consulting social work to set up outpatient follow-up.

## 2019-11-14 NOTE — Discharge Instructions (Signed)
Prescription sent to your pharmacy for seroquel. Take this medicine at bedtime.    Your kidney function was slightly elevated today.  Your creatinine was 1.45.  You should have this rechecked in 1 week by your primary care doctor.  In the meantime please increase your water intake.    Follow-up with neurology Dr. Laurence Slate.  Call his office to schedule next available appointment. His office number is included in your discharge papers.  Return to the emergency department for any new or worsening symptoms.

## 2019-11-14 NOTE — ED Provider Notes (Signed)
Emergency Medicine Observation Re-evaluation Note  Christy Hanson is a 79 y.o. female, seen on rounds today.  Pt initially presented to the ED for complaints of Hallucinations and Paranoid Currently, the patient is participating in TTS evaluation.  Physical Exam  BP (!) 146/65 (BP Location: Right Arm)   Pulse 96   Temp 97.8 F (36.6 C) (Oral)   Resp 17   Ht 5\' 2"  (1.575 m)   Wt 96.6 kg   SpO2 100%   BMI 38.96 kg/m  Physical Exam  PE: Constitutional: well-developed, well-nourished, no apparent distress HENT: atraumatic Pulmonary/Chest: effort normal; Abdominal: nondistended Musculoskeletal: no edema Neurological: alert  Skin: warm and dry, no rash, no diaphoresis Psychiatric: + auditory and visual hallucinations    ED Course / MDM  EKG:    I have reviewed the labs performed to date as well as medications administered while in observation.  Recent changes in the last 24 hours include head ct.  CT HEAD WITHOUT CONTRAST    TECHNIQUE:  Contiguous axial images were obtained from the base of the skull  through the vertex without intravenous contrast.    COMPARISON: None.    FINDINGS:  Brain: There is mild cerebral atrophy with widening of the  extra-axial spaces and ventricular dilatation.  There are areas of decreased attenuation within the white matter  tracts of the supratentorial brain, consistent with microvascular  disease changes.    Vascular: No hyperdense vessel or unexpected calcification.    Skull: Normal. Negative for fracture or focal lesion.    Sinuses/Orbits: No acute finding.    Other: None.    IMPRESSION:  1. Generalized cerebral atrophy.  2. No acute intracranial abnormality.      Electronically Signed  By: M.D.  On: 11/14/2019 17:37    Plan   Patient is not under full IVC at this time.  Patient had behavioral health evaluation yesterday and it was recommended that she have overnight observation with  reevaluation today.  11:03 AM TTS reassessment in process.  12:00 PM Discussed case with 11/16/2019 NP at behavioral health. She is requesting head CT to assess her hallucinations. If had CT is negative patient can be discharged home with outpatient neuro follow up which Marciano Sequin NP is setting up. She is asking for prescription for seroquil to be taken at bedtime to be prescribed for patient.  Head CT shows no acute intracranial abnormalities.  She does have generalized cerebral atrophy.  Discussed results with patient.  She is stable to be discharged home.  She will need to follow-up outpatient with neurology and has been given contact information.  Patient is agreeable with plan of care.  She feels safe being discharged home. Daughter will come pick up patient. Strict return precautions discussed.   Portions of this note were generated with Gerilyn Pilgrim. Dictation errors may occur despite best attempts at proofreading.     Scientist, clinical (histocompatibility and immunogenetics), PA-C 11/14/19 1811    11/16/19, MD 11/15/19 1350

## 2019-11-14 NOTE — ED Notes (Signed)
Pt's lunch ordered 

## 2019-11-14 NOTE — BH Assessment (Signed)
Comprehensive Clinical Assessment (CCA) Screening, Triage and Referral Note  11/14/2019 Rukaya Kleinschmidt [id number]    Pt presents to [facility] voluntarily for active audio and visual hallucinations. Per EDP report, "Ladawna Garn is a 79 y.o. female with history of diabetes mellitus, hypertension, hyperlipidemia, thyroid disease presents for evaluation of acute onset, progressively worsening visual hallucinations.  She reports that she has been experiencing hallucinations for "years" but they have significantly worsened more recently.  She states that she will typically see snakes or bugs/worms but now she is seeing people and animals.  She states that these hallucinations cause her great discomfort.  She also has occasional auditory hallucinations, hearing the ground crack.  She denies homicidal or suicidal ideation"  TTS: During assessment pt actively denies SI, HI, and SIB. Pt does admit to audio and visual hallucinations, she states that for the more than a year she has experienced AVH but it has gotten severe last 2 weeks since staying alone by herself. Pt states, " I have been seeing all types of things, snakes, rats, animals". Pt states she feels she has also seen someone in her home although no one appears to be there and hearing things as well. Pt reports she is living alone for the first time and moved out of daughters house and states she does feel a bit scared to be alone although she wants to be independent. Pt reports getting 5 hours of sleep with a fair appetite. Pt denies all symptoms of depression, no history of trauma/abuse. Pt does report increased paranoia, denies drug use past or present. Pt reports no access to weapons/violence. Pt does contract for safety at this time.    Collateral : Yesterday pt was excited and anxious, pt was hallucinating, saw someone, Locked herself out of house, thought she saw a snake in the house. The daughter notes that the hallucinations significantly  worsened after that time and where "previously she knew the hallucinations were not real now she thinks they are real".  She notes the patient has been more anxious and she is afraid the patient has not been getting as much sleep.  She states that the other day the patient locked herself out of her apartment after thinking that she saw a snake.  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 AVH became more frequent past 2 weeks in her own place. She states that pt has no psychiatric history, no SI attempts, no HI no SIB. She states pt has had increased paranoia with AVH. She states that pt does want to live alone and have independence and freedom despite AVH. She reports that pt does have sleep apnea, so very possible pt has not received the proper amount of sleep last few months.    Visit Diagnosis: Unspecified Psychosis  Disposition: Adaku, Anike, FNP recommends pt for overnight observation, reassess in the morning. Comprehensive Clinical Assessment (CCA) Note  11/14/2019 Tahj Ola [id number]  Visit Diagnosis:      ICD-10-CM   1. Visual hallucinations  R44.1   2. Auditory hallucination  R44.0       CCA Screening, Triage and Referral (STR)  Patient Reported Information How did you hear about [place]? Self  Referral name: self  Referral phone number: No data recorded  Whom do you see for routine medical problems? Primary Care  Practice/Facility Name: Dr Caleen Jobs [facility])  Practice/Facility  Phone Number: No data recorded Name of Contact: No data recorded Contact Number: No data recorded Contact Fax Number: No data recorded Prescriber Name: No data recorded Prescriber Address (if known): No data recorded  What Is the Reason for Your Visit/Call Today? No data recorded How Long Has This Been Causing You Problems? No data recorded What Do You  Feel Would Help You the Most Today? No data recorded  Have You Recently Been in Any Inpatient Treatment (Hospital/Detox/Crisis Center/28-Day Program)? No  Name/Location of Program/Hospital:No data recorded How Long Were You There? No data recorded When Were You Discharged? No data recorded  Have You Ever Received Services From [facility] Before? No data recorded Who Do You See at [facility]? No data recorded  Have You Recently Had Any Thoughts About Hurting Yourself? No  Are You Planning to Commit Suicide/Harm Yourself At This time? No   Have you Recently Had Thoughts About Hurting Someone Karolee Ohs? No  Explanation: No data recorded  Have You Used Any Alcohol or Drugs in the Past 24 Hours? No  How Long Ago Did You Use Drugs or Alcohol? No data recorded What Did You Use and How Much? No data recorded  Do You Currently Have a Therapist/Psychiatrist? No  Name of Therapist/Psychiatrist: No data recorded  Have You Been Recently Discharged From Any Office Practice or Programs? No  Explanation of Discharge From Practice/Program: No data recorded    CCA Screening Triage Referral Assessment Type of Contact: Tele-Assessment  Is this Initial or Reassessment? Initial Assessment  Date Telepsych consult ordered in [facility]:  11/13/19  Time Telepsych consult ordered in [facility]:  No data recorded  Patient Reported Information Reviewed? No data recorded Patient Left Without Being Seen? No data recorded Reason for Not Completing Assessment: No data recorded  Collateral Involvement: yes   Does Patient Have a Court Appointed Legal Guardian? No data recorded Name and Contact of Legal Guardian: No data recorded If Minor and Not Living with Parent(s), Who has Custody? No data recorded Is CPS involved or ever been involved? Never  Is APS involved or ever been involved? Never   Patient Determined To Be At Risk for Harm To Self or Others Based on Review of Patient Reported Information or  Presenting Complaint? No  Method: No data recorded Availability of Means: No data recorded Intent: No data recorded Notification Required: No data recorded Additional Information for Danger to Others Potential: No data recorded Additional Comments for Danger to Others Potential: No data recorded Are There Guns or Other Weapons in Your Home? No data recorded Types of Guns/Weapons: No data recorded Are These Weapons Safely Secured?                            No data recorded Who Could Verify You Are Able To Have These Secured: No data recorded Do You Have any Outstanding Charges, Pending Court Dates, Parole/Probation? No data recorded Contacted To Inform of Risk of Harm To Self or Others: No data recorded  Location of Assessment: [facility]   Does Patient Present under Involuntary Commitment? No  IVC Papers Initial File Date: No data recorded  [place] of Residence: [place]   Patient Currently Receiving the Following Services: Not Receiving Services   Determination of Need: No data recorded  Options For Referral: No data recorded    CCA Biopsychosocial  Intake/Chief Complaint:  CCA Intake With Chief Complaint Chief Complaint/Presenting Problem: audio/visual hallucinations Patient's Currently Reported Symptoms/Problems: NA  Individual's Strengths: NA Individual's Preferences: NA Individual's Abilities: NA Type of Services Patient Feels Are Needed: NA Initial Clinical Notes/Concerns: NA  Mental Health Symptoms Depression:  Depression: None  Mania:  Mania: None  Anxiety:   Anxiety: Worrying  Psychosis:  Psychosis: Hallucinations, Duration of symptoms less than six months  Trauma:  Trauma: None  Obsessions:  Obsessions: None  Compulsions:  Compulsions: None  Inattention:  Inattention: None  Hyperactivity/Impulsivity:  Hyperactivity/Impulsivity: N/A  Oppositional/Defiant Behaviors:  Oppositional/Defiant Behaviors: None  Emotional Irregularity:  Emotional Irregularity: None   Other Mood/Personality Symptoms:  Other Mood/Personality Symptoms: NA   Mental Status Exam Appearance and self-care  Stature:     Weight:     Clothing:  Clothing: Casual  Grooming:  Grooming: Normal  Cosmetic use:  Cosmetic Use: Age appropriate  Posture/gait:  Posture/Gait: Normal  Motor activity:  Motor Activity:  (Normal)  Sensorium  Attention:  Attention: Normal  Concentration:  Concentration: Normal  Orientation:  Orientation: Person, Place, Situation, Time  Recall/memory:     Affect and Mood  Affect:  Affect: Appropriate  Mood:  Mood: Other (Comment) (Pleasant)  Relating  Eye contact:  Eye Contact: Normal  Facial expression:     Attitude toward examiner:  Attitude Toward Examiner: Cooperative  Thought and Language  Speech flow: Speech Flow: Clear and Coherent  Thought content:  Thought Content: Appropriate to Mood and Circumstances  Preoccupation:     Hallucinations:  Hallucinations: Auditory, Visual  Organization:     Company secretaryxecutive Functions  Fund of Knowledge:  Fund of Knowledge: Good  Intelligence:  Intelligence: Average  Abstraction:  Abstraction: Functional  Judgement:  Judgement: Good  Reality Testing:     Insight:  Insight: Gaps, Good  Decision Making:     Social Functioning  Social Maturity:  Social Maturity: Responsible  Social Judgement:  Social Judgement: Normal  Stress  Stressors:  Stressors: Grief/losses, Other (Comment), Transitions  Coping Ability:     Skill Deficits:     Supports:  Supports: Other (Comment) (Daughter)     Religion: Religion/Spirituality How Might This Affect Treatment?: NA  Leisure/Recreation:    Exercise/Diet: Exercise/Diet Do You Have Any Trouble Sleeping?: Yes Explanation of Sleeping Difficulties: active hallucinations   CCA Employment/Education  Employment/Work Situation: Employment / Work Situation Employment situation: Retired Has patient ever been in the Eli Lilly and Companymilitary?: No  Education: Education Is Patient  Currently Attending School?: No Did Garment/textile technologistYou Graduate From [employer] School?: Yes Did Designer, television/film setYou Attend Graduate School?: Yes Did You Have Any Scientist, research (life sciences)pecial Interests In School?: none Did You Have An Individualized Education Program (IIEP): No Did You Have Any Difficulty At Progress EnergySchool?: No Patient's Education Has Been Impacted by Current Illness: No   CCA Family/Childhood History  Family and Relationship History: Family history What is your sexual orientation?: NA Has your sexual activity been affected by drugs, alcohol, medication, or emotional stress?: NA Does patient have children?: Yes How many children?: 1 How is patient's relationship with their children?: good  Childhood History:  Childhood History Additional childhood history information: NA Description of patient's relationship with caregiver when they were a child: NA Patient's description of current relationship with people who raised him/her: NA How were you disciplined when you got in trouble as a child/adolescent?: NA Did patient suffer any verbal/emotional/physical/sexual abuse as a child?: No Did patient suffer from severe childhood neglect?: No Has patient ever been sexually abused/assaulted/raped as an adolescent or adult?: No Was the patient ever a victim of a crime or a disaster?: No Witnessed domestic violence?: No  Has patient been affected by domestic violence as an adult?: No  Child/Adolescent Assessment:     CCA Substance Use  Alcohol/Drug Use: Alcohol / Drug Use Pain Medications: see MAR Prescriptions: see MAR Over the Counter: see MAR History of alcohol / drug use?: No history of alcohol / drug abuse                         ASAM's:  Six Dimensions of Multidimensional Assessment  Dimension 1:  Acute Intoxication and/or Withdrawal Potential:      Dimension 2:  Biomedical Conditions and Complications:      Dimension 3:  Emotional, Behavioral, or Cognitive Conditions and Complications:     Dimension 4:   Readiness to Change:     Dimension 5:  Relapse, Continued use, or Continued Problem Potential:     Dimension 6:  Recovery/Living Environment:     ASAM Severity Score:    ASAM Recommended Level of Treatment:     Substance use Disorder (SUD)    Recommendations for Services/Supports/Treatments:    DSM5 Diagnoses: There are no problems to display for this patient.    Referrals to Alternative Service(s): Referred to Alternative Service(s):   Place:   Date:   Time:    Referred to Alternative Service(s):   Place:   Date:   Time:    Referred to Alternative Service(s):   Place:   Date:   Time:    Referred to Alternative Service(s):   Place:   Date:   Time:     Donato Heinz   ICD-10-CM   1. Visual hallucinations  R44.1   2. Auditory hallucination  R44.0     Patient Reported Information How did you hear about [place]? Self   Referral name: self   Referral phone number: No data recorded Whom do you see for routine medical problems? Primary Care   Practice/Facility Name: Dr Rolla Flatten [facility])   Practice/Facility Phone Number: No data recorded  Name of Contact: No data recorded  Contact Number: No data recorded  Contact Fax Number: No data recorded  Prescriber Name: No data recorded  Prescriber Address (if known): No data recorded What Is the Reason for Your Visit/Call Today? No data recorded How Long Has This Been Causing You Problems? No data recorded Have You Recently Been in Any Inpatient Treatment (Hospital/Detox/Crisis Center/28-Day Program)? No   Name/Location of Program/Hospital:No data recorded  How Long Were You There? No data recorded  When Were You Discharged? No data recorded Have You Ever Received Services From [facility] Before? No data recorded  Who Do You See at [facility]? No data recorded Have You Recently Had Any Thoughts About Hurting Yourself? No   Are You Planning to Commit Suicide/Harm Yourself At This time?  No  Have you Recently  Had Thoughts About [place]? No   Explanation: No data recorded Have You Used Any Alcohol or Drugs in the Past 24 Hours? No   How Long Ago Did You Use Drugs or Alcohol?  No data recorded  What Did You Use and How Much? No data recorded What Do You Feel Would Help You the Most Today? No data recorded Do You Currently Have a Therapist/Psychiatrist? No   Name of Therapist/Psychiatrist: No data recorded  Have You Been Recently Discharged From Any Office Practice or Programs? No   Explanation of Discharge From Practice/Program:  No data recorded    CCA Screening Triage Referral Assessment  Type of Contact: Tele-Assessment   Is this Initial or Reassessment? Initial Assessment   Date Telepsych consult ordered in [facility]:  11/13/19   Time Telepsych consult ordered in [facility]:  No data recorded Patient Reported Information Reviewed? No data recorded  Patient Left Without Being Seen? No data recorded  Reason for Not Completing Assessment: No data recorded Collateral Involvement: yes  Does Patient Have a Court Appointed Legal Guardian? No data recorded  Name and Contact of Legal Guardian:  No data recorded If Minor and Not Living with Parent(s), Who has Custody? No data recorded Is CPS involved or ever been involved? Never  Is APS involved or ever been involved? Never  Patient Determined To Be At Risk for Harm To Self or Others Based on Review of Patient Reported Information or Presenting Complaint? No   Method: No data recorded  Availability of Means: No data recorded  Intent: No data recorded  Notification Required: No data recorded  Additional Information for Danger to Others Potential:  No data recorded  Additional Comments for Danger to Others Potential:  No data recorded  Are There Guns or Other Weapons in Your Home?  No data recorded   Types of Guns/Weapons: No data recorded   Are These Weapons Safely Secured?                              No data recorded   Who Could  Verify You Are Able To Have These Secured:    No data recorded Do You Have any Outstanding Charges, Pending Court Dates, Parole/Probation? No data recorded Contacted To Inform of Risk of Harm To Self or Others: No data recorded Location of Assessment: [facility] ED  Does Patient Present under Involuntary Commitment? No   IVC Papers Initial File Date: No data recorded  [place] of Residence: [place]  Patient Currently Receiving the Following Services: Not Receiving Services   Determination of Need: No data recorded  Options For Referral: No data recorded  Natasha Mead, LCSWA

## 2019-11-14 NOTE — ED Notes (Signed)
Pt given washcloths, soap, lotion, toothpaste, and toothbrush.

## 2019-11-14 NOTE — ED Notes (Signed)
Pt reports she was recently moved here by her dtr from [facility]. 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 When she was discharged home, her home was packed up. She states some of her stuff was thrown away and she really does like to shop- that's "her thing". She reports she has been seeing things that are not there for quite some time but no one offered her meds for it. She reports that she would like help and would like to remember things well. She understands the brain changes as you age.  RN explained to her that there is a process and she will need to change into scrubs. She requested washcloth to "freshen up".  She is currently changing now.

## 2019-11-14 NOTE — ED Notes (Signed)
Pt observed sleeping sitting up, appears she is going to fall over.  RN was able to wake her and convince her to lie down in the bed and rest.  Pt given warm blanket.  Calm and cooperative.  Pt asked for food however was asleep prior to RN returning.

## 2019-11-14 NOTE — ED Notes (Addendum)
Pt waiting for CT.  Ct called to ask if pt in line. She is

## 2019-11-14 NOTE — ED Notes (Signed)
Per Pt's permission, RN called and spoke to daughter to give update.  Would like [facility] to call and get information after assessment.

## 2019-11-14 NOTE — ED Notes (Signed)
TTS occuring

## 2020-01-18 ENCOUNTER — Inpatient Hospital Stay (HOSPITAL_COMMUNITY)
Admission: EM | Admit: 2020-01-18 | Discharge: 2020-01-25 | DRG: 177 | Disposition: A | Discharge status: Home Health | Payer: Medicare Other | Attending: Internal Medicine | Admitting: Internal Medicine

## 2020-01-18 ENCOUNTER — Emergency Department (HOSPITAL_COMMUNITY): Payer: Medicare Other

## 2020-01-18 ENCOUNTER — Encounter (HOSPITAL_COMMUNITY): Payer: Self-pay

## 2020-01-18 ENCOUNTER — Other Ambulatory Visit: Payer: Self-pay

## 2020-01-18 DIAGNOSIS — I130 Hypertensive heart and chronic kidney disease with heart failure and stage 1 through stage 4 chronic kidney disease, or unspecified chronic kidney disease: Secondary | ICD-10-CM | POA: Diagnosis present

## 2020-01-18 DIAGNOSIS — U071 COVID-19: Secondary | ICD-10-CM | POA: Diagnosis present

## 2020-01-18 DIAGNOSIS — E785 Hyperlipidemia, unspecified: Secondary | ICD-10-CM | POA: Diagnosis not present

## 2020-01-18 DIAGNOSIS — I5032 Chronic diastolic (congestive) heart failure: Secondary | ICD-10-CM | POA: Diagnosis not present

## 2020-01-18 DIAGNOSIS — K279 Peptic ulcer, site unspecified, unspecified as acute or chronic, without hemorrhage or perforation: Secondary | ICD-10-CM | POA: Diagnosis not present

## 2020-01-18 DIAGNOSIS — R0902 Hypoxemia: Secondary | ICD-10-CM

## 2020-01-18 DIAGNOSIS — E039 Hypothyroidism, unspecified: Secondary | ICD-10-CM | POA: Diagnosis present

## 2020-01-18 DIAGNOSIS — Z781 Physical restraint status: Secondary | ICD-10-CM

## 2020-01-18 DIAGNOSIS — E7800 Pure hypercholesterolemia, unspecified: Secondary | ICD-10-CM | POA: Diagnosis present

## 2020-01-18 DIAGNOSIS — N183 Chronic kidney disease, stage 3 unspecified: Secondary | ICD-10-CM | POA: Diagnosis present

## 2020-01-18 DIAGNOSIS — N1831 Chronic kidney disease, stage 3a: Secondary | ICD-10-CM | POA: Diagnosis not present

## 2020-01-18 DIAGNOSIS — Z833 Family history of diabetes mellitus: Secondary | ICD-10-CM

## 2020-01-18 DIAGNOSIS — E1122 Type 2 diabetes mellitus with diabetic chronic kidney disease: Secondary | ICD-10-CM | POA: Diagnosis present

## 2020-01-18 DIAGNOSIS — R0600 Dyspnea, unspecified: Secondary | ICD-10-CM

## 2020-01-18 DIAGNOSIS — J069 Acute upper respiratory infection, unspecified: Secondary | ICD-10-CM

## 2020-01-18 DIAGNOSIS — E870 Hyperosmolality and hypernatremia: Secondary | ICD-10-CM | POA: Diagnosis present

## 2020-01-18 DIAGNOSIS — J1282 Pneumonia due to coronavirus disease 2019: Secondary | ICD-10-CM | POA: Diagnosis present

## 2020-01-18 DIAGNOSIS — I10 Essential (primary) hypertension: Secondary | ICD-10-CM | POA: Diagnosis present

## 2020-01-18 DIAGNOSIS — G9341 Metabolic encephalopathy: Secondary | ICD-10-CM | POA: Diagnosis not present

## 2020-01-18 DIAGNOSIS — Z79899 Other long term (current) drug therapy: Secondary | ICD-10-CM | POA: Diagnosis not present

## 2020-01-18 DIAGNOSIS — Z794 Long term (current) use of insulin: Secondary | ICD-10-CM

## 2020-01-18 DIAGNOSIS — E1129 Type 2 diabetes mellitus with other diabetic kidney complication: Secondary | ICD-10-CM | POA: Diagnosis present

## 2020-01-18 DIAGNOSIS — J9601 Acute respiratory failure with hypoxia: Secondary | ICD-10-CM | POA: Diagnosis present

## 2020-01-18 DIAGNOSIS — E11649 Type 2 diabetes mellitus with hypoglycemia without coma: Secondary | ICD-10-CM | POA: Diagnosis not present

## 2020-01-18 HISTORY — DX: Acute upper respiratory infection, unspecified: J06.9

## 2020-01-18 HISTORY — DX: COVID-19: U07.1

## 2020-01-18 LAB — CBC WITH DIFFERENTIAL/PLATELET
Abs Immature Granulocytes: 0.1 10*3/uL — ABNORMAL HIGH (ref 0.00–0.07)
Basophils Absolute: 0 10*3/uL (ref 0.0–0.1)
Basophils Relative: 0 %
Eosinophils Absolute: 0 10*3/uL (ref 0.0–0.5)
Eosinophils Relative: 0 %
HCT: 41.6 % (ref 36.0–46.0)
Hemoglobin: 13.2 g/dL (ref 12.0–15.0)
Immature Granulocytes: 1 %
Lymphocytes Relative: 14 %
Lymphs Abs: 1.3 10*3/uL (ref 0.7–4.0)
MCH: 25.2 pg — ABNORMAL LOW (ref 26.0–34.0)
MCHC: 31.7 g/dL (ref 30.0–36.0)
MCV: 79.5 fL — ABNORMAL LOW (ref 80.0–100.0)
Monocytes Absolute: 0.6 10*3/uL (ref 0.1–1.0)
Monocytes Relative: 6 %
Neutro Abs: 7.5 10*3/uL (ref 1.7–7.7)
Neutrophils Relative %: 79 %
Platelets: 248 10*3/uL (ref 150–400)
RBC: 5.23 MIL/uL — ABNORMAL HIGH (ref 3.87–5.11)
RDW: 17.3 % — ABNORMAL HIGH (ref 11.5–15.5)
WBC: 9.5 10*3/uL (ref 4.0–10.5)
nRBC: 0 % (ref 0.0–0.2)

## 2020-01-18 LAB — D-DIMER, QUANTITATIVE: D-Dimer, Quant: 2.54 ug/mL-FEU — ABNORMAL HIGH (ref 0.00–0.50)

## 2020-01-18 LAB — COMPREHENSIVE METABOLIC PANEL
ALT: 54 U/L — ABNORMAL HIGH (ref 0–44)
AST: 73 U/L — ABNORMAL HIGH (ref 15–41)
Albumin: 2.7 g/dL — ABNORMAL LOW (ref 3.5–5.0)
Alkaline Phosphatase: 109 U/L (ref 38–126)
Anion gap: 14 (ref 5–15)
BUN: 38 mg/dL — ABNORMAL HIGH (ref 8–23)
CO2: 21 mmol/L — ABNORMAL LOW (ref 22–32)
Calcium: 8.7 mg/dL — ABNORMAL LOW (ref 8.9–10.3)
Chloride: 100 mmol/L (ref 98–111)
Creatinine, Ser: 1.95 mg/dL — ABNORMAL HIGH (ref 0.44–1.00)
GFR calc Af Amer: 28 mL/min — ABNORMAL LOW (ref >60)
GFR calc non Af Amer: 24 mL/min — ABNORMAL LOW (ref >60)
Glucose, Bld: 161 mg/dL — ABNORMAL HIGH (ref 70–99)
Potassium: 4.4 mmol/L (ref 3.5–5.1)
Sodium: 135 mmol/L (ref 135–145)
Total Bilirubin: 1.8 mg/dL — ABNORMAL HIGH (ref 0.3–1.2)
Total Protein: 7.3 g/dL (ref 6.5–8.1)

## 2020-01-18 LAB — SARS CORONAVIRUS 2 BY RT PCR (HOSPITAL ORDER, PERFORMED IN ~~LOC~~ HOSPITAL LAB): SARS Coronavirus 2: POSITIVE — AB

## 2020-01-18 LAB — TRIGLYCERIDES: Triglycerides: 188 mg/dL — ABNORMAL HIGH (ref <150)

## 2020-01-18 LAB — FERRITIN: Ferritin: 315 ng/mL — ABNORMAL HIGH (ref 11–307)

## 2020-01-18 LAB — PROCALCITONIN: Procalcitonin: 0.99 ng/mL

## 2020-01-18 LAB — FIBRINOGEN: Fibrinogen: 785 mg/dL — ABNORMAL HIGH (ref 210–475)

## 2020-01-18 LAB — LACTATE DEHYDROGENASE: LDH: 414 U/L — ABNORMAL HIGH (ref 98–192)

## 2020-01-18 LAB — LACTIC ACID, PLASMA: Lactic Acid, Venous: 3.4 mmol/L (ref 0.5–1.9)

## 2020-01-18 LAB — C-REACTIVE PROTEIN: CRP: 24.8 mg/dL — ABNORMAL HIGH (ref <1.0)

## 2020-01-18 MED ORDER — SODIUM CHLORIDE 0.9 % IV SOLN
100.0000 mg | Freq: Every day | INTRAVENOUS | Status: AC
  Administered 2020-01-19 – 2020-01-22 (×4): 100 mg via INTRAVENOUS
  Filled 2020-01-18 (×4): qty 20

## 2020-01-18 MED ORDER — METHYLPREDNISOLONE SODIUM SUCC 125 MG IJ SOLR
125.0000 mg | Freq: Once | INTRAMUSCULAR | Status: AC
  Administered 2020-01-18: 125 mg via INTRAVENOUS
  Filled 2020-01-18: qty 2

## 2020-01-18 MED ORDER — SODIUM CHLORIDE 0.9 % IV SOLN
200.0000 mg | Freq: Once | INTRAVENOUS | Status: AC
  Administered 2020-01-18: 200 mg via INTRAVENOUS
  Filled 2020-01-18: qty 200

## 2020-01-18 MED ORDER — SODIUM CHLORIDE 0.9 % IV BOLUS
1000.0000 mL | Freq: Once | INTRAVENOUS | Status: AC
  Administered 2020-01-18: 1000 mL via INTRAVENOUS

## 2020-01-18 NOTE — ED Notes (Addendum)
Date and time results received: 01/18/20 2257 (use smartphrase ".now" to insert current time)  Test: Lactic  Critical Value:3.4  Name of Provider Notified: Messick Md Orders Received? Or Actions Taken?: waiting on orders

## 2020-01-18 NOTE — ED Notes (Addendum)
Date and time results received: 01/18/20 2256 (use smartphrase ".now" to insert current time)  Test: Covid Critical Value: positive Name of Provider Notified: Messick MD Orders Received? Or Actions Taken?: no new orders

## 2020-01-18 NOTE — ED Triage Notes (Signed)
Pt arrived via EMS from home. Pt has been exposed to COVID at home. EMS reports some rhonchi on auscultation. Pt complains of generalized weakness and SOB. A&Ox4. Pt's O2 sat was 87% on RA. EMS placed her on 4L [place] and her O2 sat increased to 97%.  Vitals from EMS: HR: 120 bpm BP: 110/90 Temp: 97.7 Respirations: 20  Gluc 145

## 2020-01-18 NOTE — ED Provider Notes (Signed)
[facility]-EMERGENCY DEPT Provider Note   CSN: [id number] Arrival date & time: 01/18/20  2042     History Chief Complaint  Patient presents with  . Shortness of Breath  . Covid Exposure    Christy Hanson is a 79 y.o. female.  79 year old female with prior medical history as detailed below presents for evaluation of shortness of breath.  Patient with reported Covid exposures at home.  Patient was complaining of shortness of breath to EMS.  EMS noted that patient was hypoxic on room air down to the mid 80s.  On 4 L nasal cannula her O2 improved to greater than 90%.  Patient without complaint of recent fever or chest pain.    The history is provided by the patient, medical records and the EMS personnel.  Shortness of Breath Severity:  Moderate Onset quality:  Gradual Duration:  4 days Timing:  Constant Progression:  Worsening Chronicity:  New Relieved by:  Nothing Worsened by:  Nothing Ineffective treatments:  None tried      Past Medical History:  Diagnosis Date  . Diabetes mellitus without complication (HCC)   . High cholesterol   . Hypertension   . Thyroid disease     There are no problems to display for this patient.   Past Surgical History:  Procedure Laterality Date  . gallstone    . HERNIA REPAIR       OB History   No obstetric history on file.     History reviewed. No pertinent family history.  Social History   Tobacco Use  . Smoking status: Never Smoker  . Smokeless tobacco: Never Used  Substance Use Topics  . Alcohol use: No  . Drug use: No    Home Medications Prior to Admission medications   Medication Sig Start Date End Date Taking? Authorizing Provider  benzonatate (TESSALON) 100 MG capsule Take 1 capsule (100 mg total) by mouth every 8 (eight) hours. Patient not taking: Reported on 05/06/2015 10/22/12   Hess, Nada Boozer, PA-C  brimonidine (ALPHAGAN) 0.15 % ophthalmic solution Place 1 drop into both eyes 2 (two)  times daily. 11/05/19   [provider]  diclofenac (VOLTAREN) 75 MG EC tablet Take 75 mg by mouth 2 (two) times daily.    [provider]  diclofenac Sodium (VOLTAREN) 1 % GEL Apply 1 application topically 2 (two) times daily as needed for pain. 11/05/19   [provider]  dorzolamide-timolol (COSOPT) 22.3-6.8 MG/ML ophthalmic solution Place 1 drop into both eyes 2 (two) times daily. 04/25/15   [provider]  furosemide (LASIX) 40 MG tablet Take 40 mg by mouth daily.  03/14/15   [provider]  ibuprofen (ADVIL,MOTRIN) 200 MG tablet Take 200 mg by mouth every 6 (six) hours as needed for pain.    [provider]  latanoprost (XALATAN) 0.005 % ophthalmic solution Place 1 drop into both eyes at bedtime. 11/05/19   [provider]  LEVEMIR FLEXTOUCH 100 UNIT/ML Pen Inject 40 Units into the skin 2 (two) times daily. 03/18/15   [provider]  lovastatin (MEVACOR) 20 MG tablet Take 20 mg by mouth at bedtime. 08/20/19   [provider]  metFORMIN (GLUCOPHAGE) 1000 MG tablet Take 1,000 mg by mouth 2 (two) times daily. 10/17/19   [provider]  metoprolol (LOPRESSOR) 100 MG tablet Take 100 mg by mouth 2 (two) times daily.    [provider]  Multiple Vitamin (MULTIVITAMIN WITH MINERALS) TABS tablet Take 1 tablet by  mouth daily.    [provider]  pantoprazole (PROTONIX) 40 MG tablet Take 40 mg by mouth 2 (two) times daily. 07/27/19   [provider]  QUEtiapine (SEROQUEL) 50 MG tablet Take 1 tablet (50 mg total) by mouth at bedtime. 11/14/19 12/14/19  Albrizze, Kaitlyn E, PA-C  triamcinolone ointment (KENALOG) 0.1 % Apply 1 application topically 3 (three) times daily as needed (Skin rash).  02/23/15   [provider]    Allergies    Patient has no known allergies.  Review of Systems   Review of Systems  Respiratory: Positive for shortness of breath.   All other systems reviewed and  are negative.   Physical Exam Updated Vital Signs BP (!) 143/76   Pulse (!) 126   Temp 98 F (36.7 C) (Oral)   Resp (!) 22   Ht 5\' 2"  (1.575 m)   Wt 97.5 kg   SpO2 100%   BMI 39.32 kg/m   Physical Exam Vitals and nursing note reviewed.  Constitutional:      General: She is not in acute distress.    Appearance: She is well-developed.  HENT:     Head: Normocephalic and atraumatic.  Eyes:     Conjunctiva/sclera: Conjunctivae normal.     Pupils: Pupils are equal, round, and reactive to light.  Cardiovascular:     Rate and Rhythm: Normal rate and regular rhythm.     Heart sounds: Normal heart sounds.  Pulmonary:     Effort: Pulmonary effort is normal. No respiratory distress.     Breath sounds: Examination of the right-lower field reveals decreased breath sounds. Examination of the left-lower field reveals decreased breath sounds. Decreased breath sounds present.  Abdominal:     General: There is no distension.     Palpations: Abdomen is soft.     Tenderness: There is no abdominal tenderness.  Musculoskeletal:        General: No deformity. Normal range of motion.     Cervical back: Normal range of motion and neck supple.  Skin:    General: Skin is warm and dry.  Neurological:     Mental Status: She is alert and oriented to person, place, and time.     ED Results / Procedures / Treatments   Labs (all labs ordered are listed, but only abnormal results are displayed) Labs Reviewed  CULTURE, BLOOD (ROUTINE X 2)  CULTURE, BLOOD (ROUTINE X 2)  SARS CORONAVIRUS 2 BY RT PCR (HOSPITAL ORDER, PERFORMED IN [facility])  LACTIC ACID, PLASMA  LACTIC ACID, PLASMA  CBC WITH DIFFERENTIAL/PLATELET  COMPREHENSIVE METABOLIC PANEL  D-DIMER, QUANTITATIVE (NOT AT [facility])  PROCALCITONIN  LACTATE DEHYDROGENASE  FERRITIN  TRIGLYCERIDES  FIBRINOGEN  C-REACTIVE PROTEIN    EKG EKG Interpretation  Date/Time:  Friday January 18 2020 21:12:23 EDT Ventricular Rate:  123 PR  Interval:    QRS Duration: 97 QT Interval:  300 QTC Calculation: 430 R Axis:   29 Text Interpretation: Sinus tachycardia Confirmed by [id number] [phone]) on 01/18/2020 9:17:51 PM   Radiology DG Chest Port 1 View  Result Date: 01/18/2020 CLINICAL DATA:  Cough and shortness of breath, history of recent COVID exposure EXAM: PORTABLE CHEST 1 VIEW COMPARISON:  10/22/2012 FINDINGS: Cardiac shadow is at the upper limits of normal in size. Mild bibasilar airspace opacities are noted. Correlate with COVID-19 testing. No sizable effusion is seen. No bony abnormality is noted. IMPRESSION: Bibasilar airspace opacities.  Correlate with COVID testing Electronically Signed   By: 12/22/2012  Lukens M.D.   On: 01/18/2020 22:01    Procedures Procedures (including critical care time)  Medications Ordered in ED Medications - No data to display  ED Course  I have reviewed the triage vital signs and the nursing notes.  Pertinent labs & imaging results that were available during my care of the patient were reviewed by me and considered in my medical decision making (see chart for details).    MDM Rules/Calculators/A&P                          MDM  Screen complete  Calin Holloran was evaluated in Emergency Department on 01/18/2020 for the symptoms described in the history of present illness. She was evaluated in the context of the global COVID-19 pandemic, which necessitated consideration that the patient might be at risk for infection with the SARS-CoV-2 virus that causes COVID-19. Institutional protocols and algorithms that pertain to the evaluation of patients at risk for COVID-19 are in a state of rapid change based on information released by regulatory bodies including the CDC and federal and state organizations. These policies and algorithms were followed during the patient's care in the ED.  Patient is presenting for evaluation of reported shortness of breath and documented hypoxia by EMS.  Her  presentation is concerning for possible Covid-related pneumonia.  Given the patient's apparent hypoxia patient will likely require admission for further work-up and treatment.  Dr. Nicanor Alcon aware of pending labs and disposition.     Final Clinical Impression(s) / ED Diagnoses Final diagnoses:  Dyspnea, unspecified type  Hypoxia    Rx / DC Orders ED Discharge Orders    None       Wynetta Fines, MD 01/18/20 2248

## 2020-01-19 ENCOUNTER — Encounter (HOSPITAL_COMMUNITY): Payer: Self-pay | Admitting: Internal Medicine

## 2020-01-19 DIAGNOSIS — I10 Essential (primary) hypertension: Secondary | ICD-10-CM | POA: Diagnosis not present

## 2020-01-19 DIAGNOSIS — E1129 Type 2 diabetes mellitus with other diabetic kidney complication: Secondary | ICD-10-CM | POA: Diagnosis present

## 2020-01-19 DIAGNOSIS — U071 COVID-19: Secondary | ICD-10-CM | POA: Diagnosis not present

## 2020-01-19 DIAGNOSIS — J069 Acute upper respiratory infection, unspecified: Secondary | ICD-10-CM

## 2020-01-19 DIAGNOSIS — N183 Chronic kidney disease, stage 3 unspecified: Secondary | ICD-10-CM | POA: Diagnosis present

## 2020-01-19 HISTORY — DX: Type 2 diabetes mellitus with other diabetic kidney complication: E11.29

## 2020-01-19 HISTORY — DX: Chronic kidney disease, stage 3 unspecified: N18.30

## 2020-01-19 LAB — LACTIC ACID, PLASMA
Lactic Acid, Venous: 1.7 mmol/L (ref 0.5–1.9)
Lactic Acid, Venous: 1.2 mmol/L (ref 0.5–1.9)

## 2020-01-19 LAB — CBG MONITORING, ED
Glucose-Capillary: 188 mg/dL — ABNORMAL HIGH (ref 70–99)
Glucose-Capillary: 240 mg/dL — ABNORMAL HIGH (ref 70–99)

## 2020-01-19 LAB — GLUCOSE, CAPILLARY: Glucose-Capillary: 171 mg/dL — ABNORMAL HIGH (ref 70–99)

## 2020-01-19 LAB — CBC
HCT: 37 % (ref 36.0–46.0)
Hemoglobin: 11.9 g/dL — ABNORMAL LOW (ref 12.0–15.0)
MCH: 25.6 pg — ABNORMAL LOW (ref 26.0–34.0)
MCHC: 32.2 g/dL (ref 30.0–36.0)
MCV: 79.6 fL — ABNORMAL LOW (ref 80.0–100.0)
Platelets: 244 10*3/uL (ref 150–400)
RBC: 4.65 MIL/uL (ref 3.87–5.11)
RDW: 17.1 % — ABNORMAL HIGH (ref 11.5–15.5)
WBC: 7.3 10*3/uL (ref 4.0–10.5)
nRBC: 0 % (ref 0.0–0.2)

## 2020-01-19 LAB — CREATININE, SERUM
Creatinine, Ser: 1.42 mg/dL — ABNORMAL HIGH (ref 0.44–1.00)
GFR calc Af Amer: 41 mL/min — ABNORMAL LOW (ref >60)
GFR calc non Af Amer: 35 mL/min — ABNORMAL LOW (ref >60)

## 2020-01-19 LAB — HEMOGLOBIN A1C
Hgb A1c MFr Bld: 7.6 % — ABNORMAL HIGH (ref 4.8–5.6)
Mean Plasma Glucose: 171.42 mg/dL

## 2020-01-19 LAB — ABO/RH: ABO/RH(D): O POS

## 2020-01-19 MED ORDER — METHYLPREDNISOLONE SODIUM SUCC 125 MG IJ SOLR
0.5000 mg/kg | Freq: Two times a day (BID) | INTRAMUSCULAR | Status: DC
  Administered 2020-01-19 – 2020-01-25 (×12): 48.75 mg via INTRAVENOUS
  Filled 2020-01-19 (×11): qty 2

## 2020-01-19 MED ORDER — HYDRALAZINE HCL 20 MG/ML IJ SOLN: 10.0000 mg | INTRAMUSCULAR | Status: DC | PRN

## 2020-01-19 MED ORDER — FERROUS SULFATE 325 (65 FE) MG PO TABS
325.0000 mg | ORAL_TABLET | Freq: Every day | ORAL | Status: DC
  Administered 2020-01-19 – 2020-01-25 (×7): 325 mg via ORAL
  Filled 2020-01-19 (×6): qty 1

## 2020-01-19 MED ORDER — METOPROLOL TARTRATE 50 MG PO TABS
100.0000 mg | ORAL_TABLET | Freq: Two times a day (BID) | ORAL | Status: DC
  Administered 2020-01-19 – 2020-01-21 (×5): 100 mg via ORAL
  Filled 2020-01-19 (×4): qty 2
  Filled 2020-01-19: qty 4

## 2020-01-19 MED ORDER — SODIUM CHLORIDE 0.9 % IV SOLN
500.0000 mg | INTRAVENOUS | Status: DC
  Administered 2020-01-19: 500 mg via INTRAVENOUS
  Filled 2020-01-19 (×2): qty 500

## 2020-01-19 MED ORDER — ENOXAPARIN SODIUM 30 MG/0.3ML ~~LOC~~ SOLN
30.0000 mg | SUBCUTANEOUS | Status: DC
  Administered 2020-01-19: 30 mg via SUBCUTANEOUS
  Filled 2020-01-19: qty 0.3

## 2020-01-19 MED ORDER — INSULIN DETEMIR 100 UNIT/ML ~~LOC~~ SOLN
40.0000 [IU] | Freq: Two times a day (BID) | SUBCUTANEOUS | Status: DC
  Administered 2020-01-19 – 2020-01-20 (×3): 40 [IU] via SUBCUTANEOUS
  Filled 2020-01-19 (×4): qty 0.4

## 2020-01-19 MED ORDER — ASCORBIC ACID 500 MG PO TABS
500.0000 mg | ORAL_TABLET | Freq: Every day | ORAL | Status: DC
  Administered 2020-01-19 – 2020-01-25 (×7): 500 mg via ORAL
  Filled 2020-01-19 (×7): qty 1

## 2020-01-19 MED ORDER — LATANOPROST 0.005 % OP SOLN
1.0000 [drp] | Freq: Every day | OPHTHALMIC | Status: DC
  Administered 2020-01-19 – 2020-01-24 (×6): 1 [drp] via OPHTHALMIC
  Filled 2020-01-19: qty 2.5

## 2020-01-19 MED ORDER — INSULIN ASPART 100 UNIT/ML ~~LOC~~ SOLN
0.0000 [IU] | Freq: Three times a day (TID) | SUBCUTANEOUS | Status: DC
  Administered 2020-01-19: 3 [IU] via SUBCUTANEOUS
  Administered 2020-01-19: 2 [IU] via SUBCUTANEOUS
  Administered 2020-01-19 – 2020-01-21 (×2): 1 [IU] via SUBCUTANEOUS
  Administered 2020-01-22: 2 [IU] via SUBCUTANEOUS
  Administered 2020-01-22 – 2020-01-23 (×3): 3 [IU] via SUBCUTANEOUS
  Administered 2020-01-23: 5 [IU] via SUBCUTANEOUS
  Administered 2020-01-24: 3 [IU] via SUBCUTANEOUS
  Administered 2020-01-24 (×2): 7 [IU] via SUBCUTANEOUS
  Administered 2020-01-25: 5 [IU] via SUBCUTANEOUS
  Administered 2020-01-25: 2 [IU] via SUBCUTANEOUS
  Filled 2020-01-19: qty 0.09

## 2020-01-19 MED ORDER — ZINC SULFATE 220 (50 ZN) MG PO CAPS
220.0000 mg | ORAL_CAPSULE | Freq: Every day | ORAL | Status: DC
  Administered 2020-01-19 – 2020-01-25 (×7): 220 mg via ORAL
  Filled 2020-01-19 (×7): qty 1

## 2020-01-19 MED ORDER — HYDROCOD POLST-CPM POLST ER 10-8 MG/5ML PO SUER: 5.0000 mL | Freq: Two times a day (BID) | ORAL | Status: DC | PRN

## 2020-01-19 MED ORDER — BRIMONIDINE TARTRATE 0.15 % OP SOLN
1.0000 [drp] | Freq: Two times a day (BID) | OPHTHALMIC | Status: DC
  Administered 2020-01-19 – 2020-01-25 (×12): 1 [drp] via OPHTHALMIC
  Filled 2020-01-19: qty 5

## 2020-01-19 MED ORDER — PANTOPRAZOLE SODIUM 40 MG IV SOLR
40.0000 mg | INTRAVENOUS | Status: DC
  Administered 2020-01-19 – 2020-01-24 (×6): 40 mg via INTRAVENOUS
  Filled 2020-01-19 (×6): qty 40

## 2020-01-19 MED ORDER — DORZOLAMIDE HCL-TIMOLOL MAL 2-0.5 % OP SOLN
1.0000 [drp] | Freq: Two times a day (BID) | OPHTHALMIC | Status: DC
  Administered 2020-01-19 – 2020-01-25 (×12): 1 [drp] via OPHTHALMIC
  Filled 2020-01-19: qty 10

## 2020-01-19 MED ORDER — ONDANSETRON HCL 4 MG PO TABS: 4.0000 mg | ORAL_TABLET | Freq: Four times a day (QID) | ORAL | Status: DC | PRN

## 2020-01-19 MED ORDER — ONDANSETRON HCL 4 MG/2ML IJ SOLN: 4.0000 mg | Freq: Four times a day (QID) | INTRAMUSCULAR | Status: DC | PRN

## 2020-01-19 MED ORDER — ENOXAPARIN SODIUM 40 MG/0.4ML ~~LOC~~ SOLN
40.0000 mg | SUBCUTANEOUS | Status: DC
  Administered 2020-01-20 – 2020-01-25 (×6): 40 mg via SUBCUTANEOUS
  Filled 2020-01-19 (×6): qty 0.4

## 2020-01-19 MED ORDER — SODIUM CHLORIDE 0.9 % IV SOLN
1.0000 g | INTRAVENOUS | Status: DC
  Administered 2020-01-19 – 2020-01-23 (×5): 1 g via INTRAVENOUS
  Filled 2020-01-19: qty 1
  Filled 2020-01-19 (×4): qty 10
  Filled 2020-01-19: qty 1

## 2020-01-19 MED ORDER — QUETIAPINE FUMARATE 25 MG PO TABS
50.0000 mg | ORAL_TABLET | Freq: Every day | ORAL | Status: DC
  Administered 2020-01-19 – 2020-01-20 (×2): 50 mg via ORAL
  Filled 2020-01-19 (×2): qty 2

## 2020-01-19 MED ORDER — GUAIFENESIN-DM 100-10 MG/5ML PO SYRP: 10.0000 mL | ORAL_SOLUTION | ORAL | Status: DC | PRN

## 2020-01-19 NOTE — Plan of Care (Signed)
  Problem: Clinical Measurements: Goal: Respiratory complications will improve Outcome: Progressing   Problem: Clinical Measurements: Goal: Cardiovascular complication will be avoided Outcome: Progressing   Problem: Coping: Goal: Level of anxiety will decrease Outcome: Progressing   Problem: Elimination: Goal: Will not experience complications related to bowel motility Outcome: Progressing   Problem: Safety: Goal: Ability to remain free from injury will improve Outcome: Progressing   Problem: Skin Integrity: Goal: Risk for impaired skin integrity will decrease Outcome: Progressing   

## 2020-01-19 NOTE — H&P (Signed)
History and Physical    Christy Hanson RXV:[id number] DOB: 1941-02-16 DOA: 01/18/2020  [facility]P: [facility]  Patient coming from: Home.  Chief Complaint: Shortness of breath.  HPI: Christy Hanson is a 79 y.o. female with history of diabetes mellitus type 2, diastolic dysfunction, hypertension, chronic kidney disease stage III history of GI bleed was brought to the ER after patient was becoming more short of breath fatigue and weak over the last 4 days.  Patient states she was exposed to Covid infection at home.  Denies chest pain fever chills nausea vomiting or diarrhea has been a poor appetite.  ED Course: In the ER patient was requiring initially 4 L oxygen was hypotensive with elevated lactic acid.  Was given fluid bolus following which blood pressure improved.  Blood cultures were obtained chest x-ray was showing bilateral pneumonia and Covid test came back positive.  Patient's labs show creatinine 1.9 CRP of 24.8 procalcitonin 0.9 WBC 9.5 lactic acid initially was 3.4 improved to 1.7 after fluid bolus.  Patient was started on Solu-Medrol and remdesivir for acute hypoxic respiratory failure secondary to Covid infection.  Review of Systems: As per HPI, rest all negative.   Past Medical History:  Diagnosis Date  . Diabetes mellitus without complication (HCC)   . High cholesterol   . Hypertension   . Thyroid disease     Past Surgical History:  Procedure Laterality Date  . gallstone    . HERNIA REPAIR       reports that she has never smoked. She has never used smokeless tobacco. She reports that she does not drink alcohol and does not use drugs.  No Known Allergies  Family History  Problem Relation Age of Onset  . Diabetes Mellitus II Brother     Prior to Admission medications   Medication Sig Start Date End Date Taking? Authorizing Provider  brimonidine (ALPHAGAN) 0.15 % ophthalmic solution Place 1 drop into both eyes 2 (two) times daily. 11/05/19  Yes  [provider]  diclofenac Sodium (VOLTAREN) 1 % GEL Apply 1 application topically 2 (two) times daily as needed for pain. 11/05/19  Yes [provider]  dorzolamide-timolol (COSOPT) 22.3-6.8 MG/ML ophthalmic solution Place 1 drop into both eyes 2 (two) times daily. 04/25/15  Yes [provider]  ferrous sulfate 325 (65 FE) MG EC tablet Take 325 mg by mouth daily with breakfast.   Yes [provider]  furosemide (LASIX) 40 MG tablet Take 40 mg by mouth daily as needed for fluid or edema.  03/14/15  Yes [provider]  latanoprost (XALATAN) 0.005 % ophthalmic solution Place 1 drop into both eyes at bedtime. 11/05/19  Yes [provider]  LEVEMIR FLEXTOUCH 100 UNIT/ML Pen Inject 40 Units into the skin 2 (two) times daily. 03/18/15  Yes [provider]  losartan (COZAAR) 25 MG tablet Take 25 mg by mouth daily.   Yes [provider]  metFORMIN (GLUCOPHAGE) 1000 MG tablet Take 1,000 mg by mouth 2 (two) times daily. 10/17/19  Yes [provider]  metoprolol (LOPRESSOR) 100 MG tablet Take 100 mg by mouth 2 (two) times daily.   Yes [provider]  Multiple Vitamin (MULTIVITAMIN WITH MINERALS) TABS tablet Take 1 tablet by mouth daily.   Yes [provider]  triamcinolone ointment (KENALOG) 0.1 % Apply 1 application topically 3 (three) times daily as needed (Skin rash).  02/23/15  Yes [provider]  benzonatate (TESSALON) 100 MG capsule Take 1 capsule (100 mg total)  by mouth every 8 (eight) hours. Patient not taking: Reported on 05/06/2015 10/22/12   Kathrynn Speed, PA-C  QUEtiapine (SEROQUEL) 50 MG tablet Take 1 tablet (50 mg total) by mouth at bedtime. 11/14/19 12/14/19  Albrizze, Caroleen Hamman, PA-C    Physical Exam: Constitutional: Moderately built and nourished. Vitals:   01/19/20 0215 01/19/20 0226 01/19/20 0227 01/19/20 0303  BP:  (!) 142/106  130/82  Pulse: (!) 112 (!) 111 (!) 109 (!) 104  Resp:  (!) 63 20 (!) 23 (!) 47  Temp:      TempSrc:      SpO2: (!) 83% 94% 94% 97%  Weight:      Height:       Eyes: Anicteric no pallor. ENMT: No discharge from the ears eyes nose or mouth. Neck: No mass felt.  No neck rigidity. Respiratory: No rhonchi or crepitations. Cardiovascular: S1-S2 heard. Abdomen: Soft nontender bowel sounds present. Musculoskeletal: No edema. Skin: No rash. Neurologic: Alert awake oriented to time place and person.  Moves all extremities. Psychiatric: Appears normal.  Normal affect.   Labs on Admission: I have personally reviewed following labs and imaging studies  CBC: Recent Labs  Lab 01/18/20 2127  WBC 9.5  NEUTROABS 7.5  HGB 13.2  HCT 41.6  MCV 79.5*  PLT 248   Basic Metabolic Panel: Recent Labs  Lab 01/18/20 2228  NA 135  K 4.4  CL 100  CO2 21*  GLUCOSE 161*  BUN 38*  CREATININE 1.95*  CALCIUM 8.7*   GFR: Estimated Creatinine Clearance: 25.5 mL/min (A) (by C-G formula based on SCr of 1.95 mg/dL (H)). Liver Function Tests: Recent Labs  Lab 01/18/20 2228  AST 73*  ALT 54*  ALKPHOS 109  BILITOT 1.8*  PROT 7.3  ALBUMIN 2.7*   No results for input(s): LIPASE, AMYLASE in the last 168 hours. No results for input(s): AMMONIA in the last 168 hours. Coagulation Profile: No results for input(s): INR, PROTIME in the last 168 hours. Cardiac Enzymes: No results for input(s): CKTOTAL, CKMB, CKMBINDEX, TROPONINI in the last 168 hours. BNP (last 3 results) No results for input(s): PROBNP in the last 8760 hours. HbA1C: No results for input(s): HGBA1C in the last 72 hours. CBG: No results for input(s): GLUCAP in the last 168 hours. Lipid Profile: Recent Labs    01/18/20 2227  TRIG 188*   Thyroid Function Tests: No results for input(s): TSH, T4TOTAL, FREET4, T3FREE, THYROIDAB in the last 72 hours. Anemia Panel: Recent Labs    01/18/20 2228  FERRITIN 315*   Urine analysis:    Component Value Date/Time   COLORURINE YELLOW  11/13/2019 1952   APPEARANCEUR CLEAR 11/13/2019 1952   LABSPEC 1.010 11/13/2019 1952   PHURINE 5.0 11/13/2019 1952   GLUCOSEU NEGATIVE 11/13/2019 1952   HGBUR NEGATIVE 11/13/2019 1952   BILIRUBINUR NEGATIVE 11/13/2019 1952   KETONESUR NEGATIVE 11/13/2019 1952   PROTEINUR NEGATIVE 11/13/2019 1952   NITRITE NEGATIVE 11/13/2019 1952   LEUKOCYTESUR NEGATIVE 11/13/2019 1952   Sepsis Labs: @LABRCNTIP (procalcitonin:4,lacticidven:4) ) Recent Results (from the past 240 hour(s))  SARS Coronavirus 2 by RT PCR (hospital order, performed in [facility] hospital lab) Nasopharyngeal Nasopharyngeal Swab     Status: Abnormal   Collection Time: 01/18/20  8:59 PM   Specimen: Nasopharyngeal Swab  Result Value Ref Range Status   SARS Coronavirus 2 POSITIVE (A) NEGATIVE Final    Comment: RESULT CALLED TO, READ BACK BY AND VERIFIED WITH: J,NASH AT 2256 ON 01/18/20 BY A,MOHAMED (NOTE) SARS-CoV-2 target  nucleic acids are DETECTED  SARS-CoV-2 RNA is generally detectable in upper respiratory specimens  during the acute phase of infection.  Positive results are indicative  of the presence of the identified virus, but do not rule out bacterial infection or co-infection with other pathogens not detected by the test.  Clinical correlation with patient history and  other diagnostic information is necessary to determine patient infection status.  The expected result is negative.  Fact Sheet for Patients:   BoilerBrush.com.cy   Fact Sheet for Healthcare Providers:   https://pope.com/    This test is not yet approved or cleared by the [place] FDA and  has been authorized for detection and/or diagnosis of SARS-CoV-2 by FDA under an Emergency Use Authorization (EUA).  This EUA will remain in effect (meaning thi s test can be used) for the duration of  the COVID-19 declaration under Section 564(b)(1) of the Act, 21 U.S.C. section 360-bbb-3(b)(1), unless the  authorization is terminated or revoked sooner.  Performed at [facility], [address], [place], [place] [address]      Radiological Exams on Admission: DG Chest Port 1 View  Result Date: 01/18/2020 CLINICAL DATA:  Cough and shortness of breath, history of recent COVID exposure EXAM: PORTABLE CHEST 1 VIEW COMPARISON:  10/22/2012 FINDINGS: Cardiac shadow is at the upper limits of normal in size. Mild bibasilar airspace opacities are noted. Correlate with COVID-19 testing. No sizable effusion is seen. No bony abnormality is noted. IMPRESSION: Bibasilar airspace opacities.  Correlate with COVID testing Electronically Signed   By: Alcide Clever M.D.   On: 01/18/2020 22:01    EKG: Independently reviewed.  Sinus tachycardia.  Assessment/Plan Principal Problem:   Acute respiratory disease due to COVID-19 virus Active Problems:   DM (diabetes mellitus), type 2 with renal complications (HCC)   Essential hypertension   CKD (chronic kidney disease), stage III    1. Acute respiratory failure with hypoxia secondary to COVID-19 infection for which I have placed patient on Solu-Medrol continue remdesivir follow inflammatory markers.  I have discussed with patient about the off label use of Actemra/Bircatinib is side effects and contraindication patient agrees to get it if required. 2. SIRS which on admission likely from Covid infection.  Blood cultures have been sent.  Patient's blood pressure improved with fluids. 3. Diabetes mellitus type 2 on Levemir we will keep patient on sliding scale coverage closely follow CBGs since patient is on Solu-Medrol. 4. Chronic kidney disease stage III creatinine appears to be at baseline. 5. Hypertension with history of diastolic dysfunction last 2D echo done in Care Everywhere results show EF of 55 to 60% with impaired relaxation.  I am holding of Lasix and patient's Cozaar due to low blood pressure at presentation.  We will continue  metoprolol. 6. History of GI bleed underwent EGD in March 2021 showed peptic ulcer disease presently on iron supplements.  On Protonix.  Since patient has acute respiratory failure with COVID-19 infection will need close monitoring for any further worsening in inpatient status.   DVT prophylaxis: Lovenox. Code Status: Full code. Family Communication: Discussed with patient. Disposition Plan: Home. Consults called: None. Admission status: Inpatient.   Eduard Clos MD [facility] Pager [phone].  If 7PM-7AM, please contact night-coverage [email or web address] Password [facility]  01/19/2020, 4:26 AM

## 2020-01-19 NOTE — ED Notes (Signed)
ED TO INPATIENT HANDOFF REPORT  ED Nurse Name and Phone #: [phone]  S Name/Age/Gender Christy Hanson 79 y.o. female Room/Bed: WA21/WA21  Code Status   Code Status: Full Code  Home/SNF/Other Home Patient oriented to: self, place, time and situation Is this baseline? Yes   Triage Complete: Triage complete  Chief Complaint Acute respiratory disease due to COVID-19 virus [U07.1, J06.9]  Triage Note Pt arrived via EMS from home. Pt has been exposed to COVID at home. EMS reports some rhonchi on auscultation. Pt complains of generalized weakness and SOB. A&Ox4. Pt's O2 sat was 87% on RA. EMS placed her on 4L [place] and her O2 sat increased to 97%.  Vitals from EMS: HR: 120 bpm BP: 110/90 Temp: 97.7 Respirations: 20  Gluc 145    Allergies No Known Allergies  Level of Care/Admitting Diagnosis ED Disposition    ED Disposition Condition Comment   Admit  Hospital Area: [facility] [[id number]]  Level of Care: Telemetry [5]  Admit to tele based on following criteria: Monitor for Ischemic changes  May admit patient to [facility] or [facility] if equivalent level of care is available:: No  Covid Evaluation: Confirmed COVID Positive  Diagnosis: Acute respiratory disease due to COVID-19 virus [[id number]]  Admitting Physician: Eduard Clos [phone]  Attending Physician: Eduard Clos [phone]  Estimated length of stay: past midnight tomorrow  Certification:: I certify this patient will need inpatient services for at least 2 midnights       B Medical/Surgery History Past Medical History:  Diagnosis Date   Diabetes mellitus without complication (HCC)    High cholesterol    Hypertension    Thyroid disease    Past Surgical History:  Procedure Laterality Date   gallstone     HERNIA REPAIR       A IV Location/Drains/Wounds Patient Lines/Drains/Airways Status    Active Line/Drains/Airways    Name Placement date Placement time Site  Days   Peripheral IV 01/18/20 Right Antecubital 01/18/20  2126  Antecubital  1          Intake/Output Last 24 hours No intake or output data in the 24 hours ending 01/19/20 1648  Labs/Imaging Results for orders placed or performed during the hospital encounter of 01/18/20 (from the past 48 hour(s))  SARS Coronavirus 2 by RT PCR (hospital order, performed in [facility] hospital lab) Nasopharyngeal Nasopharyngeal Swab     Status: Abnormal   Collection Time: 01/18/20  8:59 PM   Specimen: Nasopharyngeal Swab  Result Value Ref Range   SARS Coronavirus 2 POSITIVE (A) NEGATIVE    Comment: RESULT CALLED TO, READ BACK BY AND VERIFIED WITH: J,NASH AT 2256 ON 01/18/20 BY A,MOHAMED (NOTE) SARS-CoV-2 target nucleic acids are DETECTED  SARS-CoV-2 RNA is generally detectable in upper respiratory specimens  during the acute phase of infection.  Positive results are indicative  of the presence of the identified virus, but do not rule out bacterial infection or co-infection with other pathogens not detected by the test.  Clinical correlation with patient history and  other diagnostic information is necessary to determine patient infection status.  The expected result is negative.  Fact Sheet for Patients:   BoilerBrush.com.cy   Fact Sheet for Healthcare Providers:   https://pope.com/    This test is not yet approved or cleared by the [place] FDA and  has been authorized for detection and/or diagnosis of SARS-CoV-2 by FDA under an Emergency Use Authorization (EUA).  This EUA will remain  in effect (meaning thi s test can be used) for the duration of  the COVID-19 declaration under Section 564(b)(1) of the Act, 21 U.S.C. section 360-bbb-3(b)(1), unless the authorization is terminated or revoked sooner.  Performed at [facility], [address] [place], [place] [address]   Lactic acid, plasma     Status: Abnormal    Collection Time: 01/18/20  9:27 PM  Result Value Ref Range   Lactic Acid, Venous 3.4 (HH) 0.5 - 1.9 mmol/L    Comment: CRITICAL RESULT CALLED TO, READ BACK BY AND VERIFIED WITH: J.NASH AT 2258 ON 01/18/20 BY N.THOMPSON Performed at [facility], [address] [place], [place] [address]   CBC WITH DIFFERENTIAL     Status: Abnormal   Collection Time: 01/18/20  9:27 PM  Result Value Ref Range   WBC 9.5 4.0 - 10.5 K/uL   RBC 5.23 (H) 3.87 - 5.11 MIL/uL   Hemoglobin 13.2 12.0 - 15.0 g/dL   HCT 73.2 36 - 46 %   MCV 79.5 (L) 80.0 - 100.0 fL   MCH 25.2 (L) 26.0 - 34.0 pg   MCHC 31.7 30.0 - 36.0 g/dL   RDW 20.2 (H) 54.2 - 70.6 %   Platelets 248 150 - 400 K/uL    Comment: REPEATED TO VERIFY PLATELET COUNT CONFIRMED BY SMEAR SPECIMEN CHECKED FOR CLOTS    nRBC 0.0 0.0 - 0.2 %   Neutrophils Relative % 79 %   Neutro Abs 7.5 1.7 - 7.7 K/uL   Lymphocytes Relative 14 %   Lymphs Abs 1.3 0.7 - 4.0 K/uL   Monocytes Relative 6 %   Monocytes Absolute 0.6 0 - 1 K/uL   Eosinophils Relative 0 %   Eosinophils Absolute 0.0 0 - 0 K/uL   Basophils Relative 0 %   Basophils Absolute 0.0 0 - 0 K/uL   Immature Granulocytes 1 %   Abs Immature Granulocytes 0.10 (H) 0.00 - 0.07 K/uL   Giant PLTs PRESENT     Comment: Performed at [facility], [address] [place], [place] [address]  D-dimer, quantitative     Status: Abnormal   Collection Time: 01/18/20  9:27 PM  Result Value Ref Range   D-Dimer, Quant 2.54 (H) 0.00 - 0.50 ug/mL-FEU    Comment: (NOTE) At the manufacturer cut-off of 0.50 ug/mL FEU, this assay has been documented to exclude PE with a sensitivity and negative predictive value of 97 to 99%.  At this time, this assay has not been approved by the FDA to exclude DVT/VTE. Results should be correlated with clinical presentation. Performed at [facility], [address], [place], [place] [address]   Fibrinogen     Status: Abnormal    Collection Time: 01/18/20  9:27 PM  Result Value Ref Range   Fibrinogen 785 (H) 210 - 475 mg/dL    Comment: Performed at [facility], [address] [place], [place] [address]  Triglycerides     Status: Abnormal   Collection Time: 01/18/20 10:27 PM  Result Value Ref Range   Triglycerides 188 (H) <150 mg/dL    Comment: Performed at [facility], [address], [place], [place] [address]  Comprehensive metabolic panel     Status: Abnormal   Collection Time: 01/18/20 10:28 PM  Result Value Ref Range   Sodium 135 135 - 145 mmol/L   Potassium 4.4 3.5 - 5.1 mmol/L   Chloride 100 98 - 111 mmol/L  CO2 21 (L) 22 - 32 mmol/L   Glucose, Bld 161 (H) 70 - 99 mg/dL    Comment: Glucose reference range applies only to samples taken after fasting for at least 8 hours.   BUN 38 (H) 8 - 23 mg/dL   Creatinine, Ser 1.611.95 (H) 0.44 - 1.00 mg/dL   Calcium 8.7 (L) 8.9 - 10.3 mg/dL   Total Protein 7.3 6.5 - 8.1 g/dL   Albumin 2.7 (L) 3.5 - 5.0 g/dL   AST 73 (H) 15 - 41 U/L   ALT 54 (H) 0 - 44 U/L   Alkaline Phosphatase 109 38 - 126 U/L   Total Bilirubin 1.8 (H) 0.3 - 1.2 mg/dL   GFR calc non Af Amer 24 (L) >60 mL/min   GFR calc Af Amer 28 (L) >60 mL/min   Anion gap 14 5 - 15    Comment: Performed at [facility], [address] [place], [place] [address]  Lactate dehydrogenase     Status: Abnormal   Collection Time: 01/18/20 10:28 PM  Result Value Ref Range   LDH 414 (H) 98 - 192 U/L    Comment: Performed at [facility], [address] [place], [place] [address]  Procalcitonin     Status: None   Collection Time: 01/18/20 10:28 PM  Result Value Ref Range   Procalcitonin 0.99 ng/mL    Comment:        Interpretation: PCT > 0.5 ng/mL and <= 2 ng/mL: Systemic infection (sepsis) is possible, but other conditions are known to elevate PCT as well. (NOTE)       Sepsis PCT Algorithm           Lower Respiratory Tract                                       Infection PCT Algorithm    ----------------------------     ----------------------------         PCT < 0.25 ng/mL                PCT < 0.10 ng/mL          Strongly encourage             Strongly discourage   discontinuation of antibiotics    initiation of antibiotics    ----------------------------     -----------------------------       PCT 0.25 - 0.50 ng/mL            PCT 0.10 - 0.25 ng/mL               OR       >80% decrease in PCT            Discourage initiation of                                            antibiotics      Encourage discontinuation           of antibiotics    ----------------------------     -----------------------------         PCT >= 0.50 ng/mL              PCT 0.26 - 0.50 ng/mL  AND       <80% decrease in PCT             Encourage initiation of                                             antibiotics       Encourage continuation           of antibiotics    ----------------------------     -----------------------------        PCT >= 0.50 ng/mL                  PCT > 0.50 ng/mL               AND         increase in PCT                  Strongly encourage                                      initiation of antibiotics    Strongly encourage escalation           of antibiotics                                     -----------------------------                                           PCT <= 0.25 ng/mL                                                 OR                                        > 80% decrease in PCT                                      Discontinue / Do not initiate                                             antibiotics  Performed at [facility], [address], [place], [place] [address]   C-reactive protein     Status: Abnormal   Collection Time: 01/18/20 10:28 PM  Result Value Ref Range   CRP 24.8 (H) <1.0 mg/dL    Comment: Performed at [facility], [address] [place], [place] [address]  Ferritin     Status: Abnormal   Collection Time: 01/18/20 10:28 PM  Result Value Ref Range   Ferritin 315 (H) 11 - 307 ng/mL    Comment: Performed at [facility], [address], [place], [place] [address]  Lactic acid, plasma     Status: None   Collection Time: 01/19/20  1:29 AM  Result Value Ref Range   Lactic Acid, Venous 1.7 0.5 - 1.9 mmol/L    Comment: Performed at [facility], [address], [place], [place] [address]  CBC     Status: Abnormal   Collection Time: 01/19/20  6:18 AM  Result Value Ref Range   WBC 7.3 4.0 - 10.5 K/uL   RBC 4.65 3.87 - 5.11 MIL/uL   Hemoglobin 11.9 (L) 12.0 - 15.0 g/dL   HCT 09.8 36 - 46 %   MCV 79.6 (L) 80.0 - 100.0 fL   MCH 25.6 (L) 26.0 - 34.0 pg   MCHC 32.2 30.0 - 36.0 g/dL   RDW 11.9 (H) 14.7 - 82.9 %   Platelets 244 150 - 400 K/uL   nRBC 0.0 0.0 - 0.2 %    Comment: Performed at [facility], [address], [place], [place] [address]  Creatinine, serum     Status: Abnormal   Collection Time: 01/19/20  6:18 AM  Result Value Ref Range   Creatinine, Ser 1.42 (H) 0.44 - 1.00 mg/dL   GFR calc non Af Amer 35 (L) >60 mL/min   GFR calc Af Amer 41 (L) >60 mL/min    Comment: Performed at [facility], [address] [place], [place] [address]  ABO/Rh     Status: None   Collection Time: 01/19/20  6:18 AM  Result Value Ref Range   ABO/RH(D)      O POS Performed at [facility], [address], [place], [place] [address]   Lactic acid, plasma     Status: None   Collection Time: 01/19/20  6:18 AM  Result Value Ref Range   Lactic Acid, Venous 1.2 0.5 - 1.9 mmol/L    Comment: Performed at [facility], [address], [place], [place] [address]  Hemoglobin A1c     Status: Abnormal   Collection Time: 01/19/20  6:18 AM  Result Value Ref Range   Hgb A1c MFr Bld 7.6 (H) 4.8 - 5.6 %    Comment:  (NOTE) Pre diabetes:          5.7%-6.4%  Diabetes:              >6.4%  Glycemic control for   <7.0% adults with diabetes    Mean Plasma Glucose 171.42 mg/dL    Comment: Performed at [facility], [address], [place], [place] [address]  CBG monitoring, ED     Status: Abnormal   Collection Time: 01/19/20  8:07 AM  Result Value Ref Range   Glucose-Capillary 188 (H) 70 - 99 mg/dL    Comment: Glucose reference range applies only to samples taken after fasting for at least 8 hours.  CBG monitoring, ED     Status: Abnormal   Collection Time: 01/19/20 11:42 AM  Result Value Ref Range   Glucose-Capillary 240 (H) 70 - 99 mg/dL    Comment: Glucose reference range applies only to samples taken after fasting for at least 8 hours.   DG Chest Port 1 View  Result Date: 01/18/2020 CLINICAL DATA:  Cough and shortness of breath, history of recent COVID exposure EXAM: PORTABLE CHEST 1 VIEW COMPARISON:  10/22/2012 FINDINGS: Cardiac shadow is at the upper limits of normal in size. Mild bibasilar airspace opacities are noted. Correlate with COVID-19 testing.  No sizable effusion is seen. No bony abnormality is noted. IMPRESSION: Bibasilar airspace opacities.  Correlate with COVID testing Electronically Signed   By: Alcide Clever M.D.   On: 01/18/2020 22:01    Pending Labs Unresulted Labs (From admission, onward)          Start     Ordered   01/26/20 0500  Creatinine, serum  (enoxaparin (LOVENOX)    CrCl >/= 30 ml/min)  Weekly,   R     Comments: while on enoxaparin therapy    01/19/20 0426   01/20/20 0500  CBC with Differential/Platelet  Daily,   R      01/19/20 0428   01/20/20 0500  Comprehensive metabolic panel  Daily,   R      01/19/20 0428   01/20/20 0500  C-reactive protein  Daily,   R      01/19/20 0428   01/20/20 0500  D-dimer, quantitative (not at [facility])  Daily,   R      01/19/20 0428   01/19/20 0430  Lactic acid, plasma  STAT Now then every 3 hours,   R (with STAT occurrences)       01/19/20 0429   01/18/20 2058  Blood Culture (routine x 2)  BLOOD CULTURE X 2,   STAT      01/18/20 2058          Vitals/Pain Today's Vitals   01/19/20 0929 01/19/20 1126 01/19/20 1402 01/19/20 1600  BP: (!) 144/125 (!) 154/133 104/68 134/70  Pulse: (!) 105 91 91 91  Resp: (!) 28 (!) 40 (!) 23 (!) 30  Temp:      TempSrc:      SpO2: 95% 96% (!) 82% 93%  Weight:      Height:      PainSc:        Isolation Precautions Airborne and Contact precautions  Medications Medications  remdesivir 100 mg in sodium chloride 0.9 % 100 mL IVPB (100 mg Intravenous New Bag/Given 01/19/20 0945)  metoprolol tartrate (LOPRESSOR) tablet 100 mg (100 mg Oral Given 01/19/20 0939)  QUEtiapine (SEROQUEL) tablet 50 mg (has no administration in time range)  insulin detemir (LEVEMIR) injection 40 Units (40 Units Subcutaneous Given 01/19/20 1044)  ferrous sulfate tablet 325 mg (325 mg Oral Given 01/19/20 1043)  brimonidine (ALPHAGAN) 0.15 % ophthalmic solution 1 drop (has no administration in time range)  dorzolamide-timolol (COSOPT) 22.3-6.8 MG/ML ophthalmic solution 1 drop (has no administration in time range)  latanoprost (XALATAN) 0.005 % ophthalmic solution 1 drop (has no administration in time range)  ondansetron (ZOFRAN) tablet 4 mg (has no administration in time range)    Or  ondansetron (ZOFRAN) injection 4 mg (has no administration in time range)  insulin aspart (novoLOG) injection 0-9 Units (3 Units Subcutaneous Given 01/19/20 1232)  methylPREDNISolone sodium succinate (SOLU-MEDROL) 125 mg/2 mL injection 48.75 mg (has no administration in time range)  guaiFENesin-dextromethorphan (ROBITUSSIN DM) 100-10 MG/5ML syrup 10 mL (has no administration in time range)  chlorpheniramine-HYDROcodone (TUSSIONEX) 10-8 MG/5ML suspension 5 mL (has no administration in time range)  ascorbic acid (VITAMIN C) tablet 500 mg (500 mg Oral Given 01/19/20 0939)  zinc sulfate capsule 220 mg (220 mg Oral Given 01/19/20 0939)   hydrALAZINE (APRESOLINE) injection 10 mg (has no administration in time range)  pantoprazole (PROTONIX) injection 40 mg (40 mg Intravenous Given 01/19/20 0938)  cefTRIAXone (ROCEPHIN) 1 g in sodium chloride 0.9 % 100 mL IVPB (1 g Intravenous New Bag/Given 01/19/20 1325)  azithromycin (ZITHROMAX)  500 mg in sodium chloride 0.9 % 250 mL IVPB (500 mg Intravenous New Bag/Given 01/19/20 1043)  enoxaparin (LOVENOX) injection 40 mg (has no administration in time range)  sodium chloride 0.9 % bolus 1,000 mL (0 mLs Intravenous Stopped 01/19/20 0130)  methylPREDNISolone sodium succinate (SOLU-MEDROL) 125 mg/2 mL injection 125 mg (125 mg Intravenous Given 01/18/20 2339)  remdesivir 200 mg in sodium chloride 0.9% 250 mL IVPB (0 mg Intravenous Stopped 01/19/20 0004)    Mobility walks Low fall risk   Focused Assessments .   R Recommendations: See Admitting Provider Note  Report given to:   Additional Notes: n/a

## 2020-01-19 NOTE — Progress Notes (Signed)
Pt stable on arrival to floor. No needs on arrival to floor. Vs stable and assessment performed.

## 2020-01-19 NOTE — ED Notes (Signed)
Placed O2 back on patient at 2L/

## 2020-01-19 NOTE — Progress Notes (Signed)
PROGRESS NOTE  Christy Hanson  DOB: 12-01-1940  PCP: [facility] XFG:[id number]  DOA: 01/18/2020  LOS: 1 day   Chief Complaint  Patient presents with  . Shortness of Breath  . Covid Exposure   Brief narrative: Christy Hanson is a 79 y.o. female with PMH of DM2, HTN, HLD, chronic diastolic CHF, CKD stage III, hypothyroidism. Patient presented to the ED on 01/18/2020 with progressively worsening shortness of breath, fatigue for last 4 days after exposure to Covid infection at home.  In the ER, patient was afebrile, heart rate 126, tachypneic to maintain oxygen saturation on room air, initial blood pressure was 143/76.  Over the next few hours, patient became more tachypneic and blood pressure plummeted down to 70/59. WBC normal at 9.5, lactic acid elevated to 3.4. Labs with creatinine elevated to 1.95, AST/ALT 73/54 COVID-19 PCR positive. LDH 414, ferritin 315, CRP 25, D-dimer 2.54, procalcitonin elevated 0.99 Chest x-ray showed bibasilar airspace opacities. Blood cultures were sent.  Patient was admitted to hospitalist service for further evaluation management.  Subjective: Patient was seen and examined this morning in the ED.  Pleasant elderly African-American female.  Not in distress.  On 3 L/min supplemental oxygen. Chart reviewed. No fever.  Tachycardia improving this morning Respiratory and 30s, oxygen saturation 97%.  Blood pressure between 130-160 mostly. Repeat labs this morning with creatinine improving to 1.42.  Assessment/Plan: COVID pneumonia Acute respiratory failure with hypoxia -Presented with worsening shortness of breath, fatigue -COVID test: PCR positive -Chest imaging: Bibasilar airspace opacities  -Treatment: Patient has been started on IV remdesivir, IV Solu-Medrol.  Not requiring Actemra baricitinib. -Protonix while on steroids. -Supportive care: Vitamin C, Zinc, PRN inhalers, Tylenol, Antitussives (benzonatate/ Mucinex/Tussionex).     -Encouraged incentive spirometry, out of bed and early mobilization as much as possible  -Oxygen - SpO2: 93 % on 3 L/min. -Continue airborne/contact isolation precautions for duration of 3 weeks from the day of diagnosis. -WBC and inflammatory markers trend as below.  Lab Results  Component Value Date   SARSCOV2NAA POSITIVE (A) 01/18/2020    Recent Labs  Lab 01/18/20 2127 01/18/20 2228 01/19/20 0129 01/19/20 0618  WBC 9.5  --   --  7.3  LATICACIDVEN 3.4*  --  1.7 1.2  PROCALCITON  --  0.99  --   --   DDIMER 2.54*  --   --   --   FERRITIN  --  315*  --   --   LDH  --  414*  --   --   CRP  --  24.8*  --   --   ALT  --  54*  --   --    The treatment plan and use of medications and known side effects were discussed with patient/family. Some of the medications used are based on case reports/anecdotal data.  All other medications being used in the management of COVID-19 based on limited study data.  Complete risks and long-term side effects are unknown, however in the best clinical judgment they seem to be of some benefit.  Patient wanted to proceed with treatment options provided.  Suspected secondary bacterial infection Sepsis with hypotension - POA  -On top of COVID-19 pneumonia, patient probably has underlying pectoral pneumonia as well. -Procalcitonin level is elevated to 0.99, lactic acid level was initially elevated to 3.4. -Blood cultures sent. -Blood pressure dipped to 70s at one point, improved with IV fluid now. -Lactic acid level improved. -I started the patient on IV Rocephin and IV azithromycin,  QTC 430 ms.  Diabetes mellitus type 2 -Home meds include Levemir 40 units twice daily, Metformin 1000 mg twice daily, -Resume the same. Continue sliding-scale insulin with Accu-Cheks.  Lab Results  Component Value Date   HGBA1C 7.6 (H) 01/19/2020   Recent Labs  Lab 01/19/20 0807 01/19/20 1142  GLUCAP 188* 240*   Hypertension Chronic diastolic CHF -Home meds  include Metoprolol 100 mg twice daily, losartan 25 mg daily, Lasix 40 mg daily as needed, -Metoprolol has been resumed.  Others on hold.  Continue to monitor blood pressure.  CKD stage III -Creatinine at baseline. Recent Labs    11/13/19 1520 01/18/20 2228 01/19/20 0618  CREATININE 1.45* 1.95* 1.42*   History of GI bleed  -underwent EGD in March 2021 showed peptic ulcer disease.  Currently on Protonix and iron supplements  Mobility: Encourage ambulation Code Status:   Code Status: Full Code  Nutritional status: Body mass index is 39.32 kg/m.     Diet Order            Diet heart healthy/carb modified Room service appropriate? Yes; Fluid consistency: Thin; Fluid restriction: 1200 mL Fluid  Diet effective now                 DVT prophylaxis: enoxaparin (LOVENOX) injection 40 mg Start: 01/20/20 1000   Antimicrobials:  IV Rocephin, IV azithromycin Fluid: None  Consultants: None Family Communication:  None  Status is: Inpatient  Remains inpatient appropriate because:Ongoing diagnostic testing needed not appropriate for outpatient work up and IV treatments appropriate due to intensity of illness or inability to take PO   Dispo: The patient is from: Home              Anticipated d/c is to: Home              Anticipated d/c date is: 3 days              Patient currently is not medically stable to d/c.       Infusions:  . azithromycin 500 mg (01/19/20 1043)  . cefTRIAXone (ROCEPHIN)  IV 1 g (01/19/20 1325)  . remdesivir 100 mg in NS 100 mL 100 mg (01/19/20 0945)    Scheduled Meds: . vitamin C  500 mg Oral Daily  . brimonidine  1 drop Both Eyes BID  . dorzolamide-timolol  1 drop Both Eyes BID  . [START ON 01/20/2020] enoxaparin (LOVENOX) injection  40 mg Subcutaneous Q24H  . ferrous sulfate  325 mg Oral Q breakfast  . insulin aspart  0-9 Units Subcutaneous TID WC  . insulin detemir  40 Units Subcutaneous BID  . latanoprost  1 drop Both Eyes QHS  .  methylPREDNISolone (SOLU-MEDROL) injection  0.5 mg/kg Intravenous Q12H  . metoprolol tartrate  100 mg Oral BID  . pantoprazole (PROTONIX) IV  40 mg Intravenous Q24H  . QUEtiapine  50 mg Oral QHS  . zinc sulfate  220 mg Oral Daily    Antimicrobials: Anti-infectives (From admission, onward)   Start     Dose/Rate Route Frequency Ordered Stop   01/19/20 1000  remdesivir 100 mg in sodium chloride 0.9 % 100 mL IVPB        100 mg 200 mL/hr over 30 Minutes Intravenous Daily 01/18/20 2316 01/23/20 0959   01/19/20 1000  cefTRIAXone (ROCEPHIN) 1 g in sodium chloride 0.9 % 100 mL IVPB        1 g 200 mL/hr over 30 Minutes Intravenous Every 24 hours 01/19/20 0836  01/19/20 1000  azithromycin (ZITHROMAX) 500 mg in sodium chloride 0.9 % 250 mL IVPB        500 mg 250 mL/hr over 60 Minutes Intravenous Every 24 hours 01/19/20 0836     01/18/20 2330  remdesivir 200 mg in sodium chloride 0.9% 250 mL IVPB        200 mg 580 mL/hr over 30 Minutes Intravenous Once 01/18/20 2316 01/19/20 0004      PRN meds: chlorpheniramine-HYDROcodone, guaiFENesin-dextromethorphan, hydrALAZINE, ondansetron **OR** ondansetron (ZOFRAN) IV   Objective: Vitals:   01/19/20 1402 01/19/20 1600  BP: 104/68 134/70  Pulse: 91 91  Resp: (!) 23 (!) 30  Temp:    SpO2: (!) 82% 93%   No intake or output data in the 24 hours ending 01/19/20 1612 Filed Weights   01/18/20 2104  Weight: 97.5 kg   Weight change:  Body mass index is 39.32 kg/m.   Physical Exam: General exam: Appears calm and comfortable.  Not in physical distress Skin: No rashes, lesions or ulcers. HEENT: Atraumatic, normocephalic, supple neck, no obvious bleeding Lungs: Clear to auscultation bilaterally CVS: Regular rate and rhythm, no murmur GI/Abd soft, nontender, nondistended, bowel sound present CNS: Alert, awake, oriented to place and person Psychiatry: Mood appropriate Extremities: No pedal edema, no calf tenderness  Data Review: I have  personally reviewed the laboratory data and studies available.  Recent Labs  Lab 01/18/20 2127 01/19/20 0618  WBC 9.5 7.3  NEUTROABS 7.5  --   HGB 13.2 11.9*  HCT 41.6 37.0  MCV 79.5* 79.6*  PLT 248 244   Recent Labs  Lab 01/18/20 2228 01/19/20 0618  NA 135  --   K 4.4  --   CL 100  --   CO2 21*  --   GLUCOSE 161*  --   BUN 38*  --   CREATININE 1.95* 1.42*  CALCIUM 8.7*  --    Lab Results  Component Value Date   HGBA1C 7.6 (H) 01/19/2020       Component Value Date/Time   TRIG 188 (H) 01/18/2020 2227    SignedLorin Glass, MD [facility] Pager: [phone] (Secure Chat preferred). 01/19/2020

## 2020-01-20 DIAGNOSIS — J069 Acute upper respiratory infection, unspecified: Secondary | ICD-10-CM | POA: Diagnosis not present

## 2020-01-20 DIAGNOSIS — U071 COVID-19: Secondary | ICD-10-CM | POA: Diagnosis not present

## 2020-01-20 LAB — COMPREHENSIVE METABOLIC PANEL
ALT: 39 U/L (ref 0–44)
AST: 44 U/L — ABNORMAL HIGH (ref 15–41)
Albumin: 2.2 g/dL — ABNORMAL LOW (ref 3.5–5.0)
Alkaline Phosphatase: 87 U/L (ref 38–126)
Anion gap: 10 (ref 5–15)
BUN: 35 mg/dL — ABNORMAL HIGH (ref 8–23)
CO2: 22 mmol/L (ref 22–32)
Calcium: 8.6 mg/dL — ABNORMAL LOW (ref 8.9–10.3)
Chloride: 110 mmol/L (ref 98–111)
Creatinine, Ser: 1.17 mg/dL — ABNORMAL HIGH (ref 0.44–1.00)
GFR calc Af Amer: 51 mL/min — ABNORMAL LOW (ref >60)
GFR calc non Af Amer: 44 mL/min — ABNORMAL LOW (ref >60)
Glucose, Bld: 98 mg/dL (ref 70–99)
Potassium: 3.8 mmol/L (ref 3.5–5.1)
Sodium: 142 mmol/L (ref 135–145)
Total Bilirubin: 0.7 mg/dL (ref 0.3–1.2)
Total Protein: 6.4 g/dL — ABNORMAL LOW (ref 6.5–8.1)

## 2020-01-20 LAB — CBC WITH DIFFERENTIAL/PLATELET
Abs Immature Granulocytes: 0.15 10*3/uL — ABNORMAL HIGH (ref 0.00–0.07)
Basophils Absolute: 0 10*3/uL (ref 0.0–0.1)
Basophils Relative: 0 %
Eosinophils Absolute: 0 10*3/uL (ref 0.0–0.5)
Eosinophils Relative: 0 %
HCT: 34.2 % — ABNORMAL LOW (ref 36.0–46.0)
Hemoglobin: 11 g/dL — ABNORMAL LOW (ref 12.0–15.0)
Immature Granulocytes: 2 %
Lymphocytes Relative: 11 %
Lymphs Abs: 1.1 10*3/uL (ref 0.7–4.0)
MCH: 25.4 pg — ABNORMAL LOW (ref 26.0–34.0)
MCHC: 32.2 g/dL (ref 30.0–36.0)
MCV: 79 fL — ABNORMAL LOW (ref 80.0–100.0)
Monocytes Absolute: 0.4 10*3/uL (ref 0.1–1.0)
Monocytes Relative: 4 %
Neutro Abs: 8.6 10*3/uL — ABNORMAL HIGH (ref 1.7–7.7)
Neutrophils Relative %: 83 %
Platelets: 285 10*3/uL (ref 150–400)
RBC: 4.33 MIL/uL (ref 3.87–5.11)
RDW: 17 % — ABNORMAL HIGH (ref 11.5–15.5)
WBC: 10.3 10*3/uL (ref 4.0–10.5)
nRBC: 0 % (ref 0.0–0.2)

## 2020-01-20 LAB — GLUCOSE, CAPILLARY
Glucose-Capillary: 119 mg/dL — ABNORMAL HIGH (ref 70–99)
Glucose-Capillary: 95 mg/dL (ref 70–99)
Glucose-Capillary: 62 mg/dL — ABNORMAL LOW (ref 70–99)
Glucose-Capillary: 73 mg/dL (ref 70–99)
Glucose-Capillary: 99 mg/dL (ref 70–99)

## 2020-01-20 LAB — URINALYSIS, ROUTINE W REFLEX MICROSCOPIC
Bilirubin Urine: NEGATIVE
Glucose, UA: NEGATIVE mg/dL
Hgb urine dipstick: NEGATIVE
Ketones, ur: NEGATIVE mg/dL
Nitrite: NEGATIVE
Protein, ur: 30 mg/dL — AB
Specific Gravity, Urine: 1.017 (ref 1.005–1.030)
pH: 5 (ref 5.0–8.0)

## 2020-01-20 LAB — D-DIMER, QUANTITATIVE: D-Dimer, Quant: 1.93 ug/mL-FEU — ABNORMAL HIGH (ref 0.00–0.50)

## 2020-01-20 LAB — C-REACTIVE PROTEIN: CRP: 13.5 mg/dL — ABNORMAL HIGH (ref <1.0)

## 2020-01-20 MED ORDER — AZITHROMYCIN 250 MG PO TABS
500.0000 mg | ORAL_TABLET | Freq: Every day | ORAL | Status: DC
  Administered 2020-01-20 – 2020-01-24 (×5): 500 mg via ORAL
  Filled 2020-01-20 (×5): qty 2

## 2020-01-20 MED ORDER — INSULIN DETEMIR 100 UNIT/ML ~~LOC~~ SOLN
40.0000 [IU] | Freq: Every day | SUBCUTANEOUS | Status: DC
  Filled 2020-01-20: qty 0.4

## 2020-01-20 NOTE — Progress Notes (Signed)
PROGRESS NOTE  Christy Hanson  DOB: 14-Nov-1940  PCP: [facility] UUV:[id number]  DOA: 01/18/2020  LOS: 2 days   Chief Complaint  Patient presents with  . Shortness of Breath  . Covid Exposure   Brief narrative: Christy Hanson is a 79 y.o. female with PMH of DM2, HTN, HLD, chronic diastolic CHF, CKD stage III, hypothyroidism. Patient presented to the ED on 01/18/2020 with progressively worsening shortness of breath, fatigue for last 4 days after exposure to Covid infection at home.  In the ER, patient was afebrile, heart rate 126, tachypneic to maintain oxygen saturation on room air, initial blood pressure was 143/76.  Over the next few hours, patient became more tachypneic and blood pressure plummeted down to 70/59. WBC normal at 9.5, lactic acid elevated to 3.4. Labs with creatinine elevated to 1.95, AST/ALT 73/54 COVID-19 PCR positive. LDH 414, ferritin 315, CRP 25, D-dimer 2.54, procalcitonin elevated 0.99 Chest x-ray showed bibasilar airspace opacities. Blood cultures were sent.  Patient was admitted to hospitalist service for further evaluation management.  Subjective: Patient was seen and examined this afternoon. Lying on bed.  Not in distress.  On 4 L oxygen by nasal collar. Feels better.  Chart reviewed. No fever.  Heart rate improved to 70s, respiratory rate in 20s. CRP down to 13.5 today.  Assessment/Plan: COVID pneumonia Acute respiratory failure with hypoxia -Presented with worsening shortness of breath, fatigue -COVID test: PCR positive -Chest imaging: Bibasilar airspace opacities  -Treatment: Currently on a 5-day course of IV remdesivir to complete on 8/31, IV Solu-Medrol 50 mg daily.  Not requiring Actemra baricitinib. -Oxygen - SpO2: 95 % O2 Flow Rate (L/min): 4 L/min  -Progression: Feels better.  On 4 L oxygen.  O2 sat 95%.  Can wean down.  CRP improving. -Continue IV remdesivir and IV Solu-Medrol. -Protonix while on  steroids. -Supportive care: Vitamin C, Zinc, PRN inhalers, Tylenol, Antitussives (benzonatate/ Mucinex/Tussionex).   -Encouraged incentive spirometry, out of bed and early mobilization as much as possible -Continue airborne/contact isolation precautions for duration of 3 weeks from the day of diagnosis. -WBC and inflammatory markers trend as below.  Lab Results  Component Value Date   SARSCOV2NAA POSITIVE (A) 01/18/2020    Recent Labs  Lab 01/18/20 2127 01/18/20 2228 01/19/20 0129 01/19/20 0618 01/20/20 1245  WBC 9.5  --   --  7.3 10.3  LATICACIDVEN 3.4*  --  1.7 1.2  --   PROCALCITON  --  0.99  --   --   --   DDIMER 2.54*  --   --   --  1.93*  FERRITIN  --  315*  --   --   --   LDH  --  414*  --   --   --   CRP  --  24.8*  --   --  13.5*  ALT  --  54*  --   --  39   The treatment plan and use of medications and known side effects were discussed with patient/family. Some of the medications used are based on case reports/anecdotal data.  All other medications being used in the management of COVID-19 based on limited study data.  Complete risks and long-term side effects are unknown, however in the best clinical judgment they seem to be of some benefit.  Patient wanted to proceed with treatment options provided.  Suspected secondary bacterial infection Sepsis with hypotension - POA  -On top of COVID-19 pneumonia, patient probably has underlying pectoral pneumonia as well. -Procalcitonin level  is elevated to 0.99, lactic acid level was initially elevated to 3.4.  -Blood cultures sent. -Blood pressure dipped to 70s at one point, improved with IV fluid now. -Lactic acid level improved as well. -Continue IV Rocephin and IV azithromycin, QTC 430 ms.  Diabetes mellitus type 2 -Home meds include Levemir 40 units twice daily, Metformin 1000 mg twice daily, -Levemir resumed.  Metformin on hold.  Continue sliding-scale insulin with Accu-Cheks.  Lab Results  Component Value Date    HGBA1C 7.6 (H) 01/19/2020   Recent Labs  Lab 01/19/20 0807 01/19/20 1142 01/19/20 2043 01/20/20 0744 01/20/20 1121  GLUCAP 188* 240* 171* 119* 95   Hypertension Chronic diastolic CHF -Home meds include Metoprolol 100 mg twice daily, losartan 25 mg daily, Lasix 40 mg daily as needed, -Metoprolol has been resumed.  Others on hold.  Continue to monitor blood pressure.  CKD stage III -Creatinine at baseline. Recent Labs    11/13/19 1520 01/18/20 2228 01/19/20 0618 01/20/20 1245  CREATININE 1.45* 1.95* 1.42* 1.17*   History of GI bleed  -underwent EGD in March 2021 showed peptic ulcer disease.  Currently on Protonix and iron supplements  Mobility: Encourage ambulation Code Status:   Code Status: Full Code  Nutritional status: Body mass index is 39.32 kg/m.     Diet Order            Diet heart healthy/carb modified Room service appropriate? Yes; Fluid consistency: Thin; Fluid restriction: 1200 mL Fluid  Diet effective now                 DVT prophylaxis: enoxaparin (LOVENOX) injection 40 mg Start: 01/20/20 1000   Antimicrobials:  IV Rocephin, IV azithromycin Fluid: None  Consultants: None Family Communication:  None  Status is: Inpatient  Remains inpatient appropriate because:Ongoing diagnostic testing needed not appropriate for outpatient work up and IV treatments appropriate due to intensity of illness or inability to take PO   Dispo: The patient is from: Home              Anticipated d/c is to: Home              Anticipated d/c date is: 3 days              Patient currently is not medically stable to d/c.       Infusions:  . cefTRIAXone (ROCEPHIN)  IV 1 g (01/20/20 1255)  . remdesivir 100 mg in NS 100 mL 100 mg (01/20/20 0836)    Scheduled Meds: . vitamin C  500 mg Oral Daily  . azithromycin  500 mg Oral Daily  . brimonidine  1 drop Both Eyes BID  . dorzolamide-timolol  1 drop Both Eyes BID  . enoxaparin (LOVENOX) injection  40 mg  Subcutaneous Q24H  . ferrous sulfate  325 mg Oral Q breakfast  . insulin aspart  0-9 Units Subcutaneous TID WC  . insulin detemir  40 Units Subcutaneous BID  . latanoprost  1 drop Both Eyes QHS  . methylPREDNISolone (SOLU-MEDROL) injection  0.5 mg/kg Intravenous Q12H  . metoprolol tartrate  100 mg Oral BID  . pantoprazole (PROTONIX) IV  40 mg Intravenous Q24H  . QUEtiapine  50 mg Oral QHS  . zinc sulfate  220 mg Oral Daily    Antimicrobials: Anti-infectives (From admission, onward)   Start     Dose/Rate Route Frequency Ordered Stop   01/20/20 1000  azithromycin (ZITHROMAX) tablet 500 mg        500 mg  Oral Daily 01/20/20 0817     01/19/20 1000  remdesivir 100 mg in sodium chloride 0.9 % 100 mL IVPB        100 mg 200 mL/hr over 30 Minutes Intravenous Daily 01/18/20 2316 01/23/20 0959   01/19/20 1000  cefTRIAXone (ROCEPHIN) 1 g in sodium chloride 0.9 % 100 mL IVPB        1 g 200 mL/hr over 30 Minutes Intravenous Every 24 hours 01/19/20 0836     01/19/20 1000  azithromycin (ZITHROMAX) 500 mg in sodium chloride 0.9 % 250 mL IVPB  Status:  Discontinued        500 mg 250 mL/hr over 60 Minutes Intravenous Every 24 hours 01/19/20 0836 01/20/20 0817   01/18/20 2330  remdesivir 200 mg in sodium chloride 0.9% 250 mL IVPB        200 mg 580 mL/hr over 30 Minutes Intravenous Once 01/18/20 2316 01/19/20 0004      PRN meds: chlorpheniramine-HYDROcodone, guaiFENesin-dextromethorphan, hydrALAZINE, ondansetron **OR** ondansetron (ZOFRAN) IV   Objective: Vitals:   01/20/20 0904 01/20/20 1453  BP: (!) 174/77 (!) 179/72  Pulse: 72 (!) 58  Resp: 14 18  Temp:  97.6 F (36.4 C)  SpO2: 94% 95%    Intake/Output Summary (Last 24 hours) at 01/20/2020 1552 Last data filed at 01/20/2020 1500 Gross per 24 hour  Intake 1151.27 ml  Output --  Net 1151.27 ml   Filed Weights   01/18/20 2104  Weight: 97.5 kg   Weight change:  Body mass index is 39.32 kg/m.   Physical Exam: General exam: Appears  calm and comfortable.  Not in physical distress Skin: No rashes, lesions or ulcers. HEENT: Atraumatic, normocephalic, supple neck, no obvious bleeding Lungs: Clear to auscultation bilaterally CVS: Regular rate and rhythm, no murmur GI/Abd soft, nontender, nondistended, bowel sound present CNS: Alert, awake, oriented to place and person Psychiatry: Mood appropriate Extremities: No pedal edema, no calf tenderness  Data Review: I have personally reviewed the laboratory data and studies available.  Recent Labs  Lab 01/18/20 2127 01/19/20 0618 01/20/20 1245  WBC 9.5 7.3 10.3  NEUTROABS 7.5  --  8.6*  HGB 13.2 11.9* 11.0*  HCT 41.6 37.0 34.2*  MCV 79.5* 79.6* 79.0*  PLT 248 244 285   Recent Labs  Lab 01/18/20 2228 01/19/20 0618 01/20/20 1245  NA 135  --  142  K 4.4  --  3.8  CL 100  --  110  CO2 21*  --  22  GLUCOSE 161*  --  98  BUN 38*  --  35*  CREATININE 1.95* 1.42* 1.17*  CALCIUM 8.7*  --  8.6*   Lab Results  Component Value Date   HGBA1C 7.6 (H) 01/19/2020       Component Value Date/Time   TRIG 188 (H) 01/18/2020 2227    SignedLorin Glass, MD [facility] Pager: [phone] (Secure Chat preferred). 01/20/2020

## 2020-01-20 NOTE — Progress Notes (Addendum)
Hypoglycemic Event  CBG: 62  Treatment: 4oz juice  Symptoms: Fatigue  Follow-up CBG: Time: 1755 CBG Result:73  Possible Reasons for Event: Poor PO intake   MD notified    Griffin Basil

## 2020-01-20 NOTE — Progress Notes (Signed)
   01/20/20 0904  Oxygen Therapy  SpO2 94 %  O2 Device Nasal Cannula  O2 Flow Rate (L/min) 4 L/min

## 2020-01-20 NOTE — Progress Notes (Signed)
PHARMACIST - PHYSICIAN COMMUNICATION  CONCERNING: Antibiotic IV to Oral Route Change Policy  RECOMMENDATION: This patient is receiving azithromycin by the intravenous route.  Based on criteria approved by the Pharmacy and Therapeutics Committee, the antibiotic(s) is/are being converted to the equivalent oral dose form(s).   DESCRIPTION: These criteria include:  Patient being treated for a respiratory tract infection, urinary tract infection, cellulitis or clostridium difficile associated diarrhea if on metronidazole  The patient is not neutropenic and does not exhibit a GI malabsorption state  The patient is eating (either orally or via tube) and/or has been taking other orally administered medications for a least 24 hours  The patient is improving clinically and has a Tmax < 100.5  If you have questions about this conversion, please contact the Pharmacy Department  []  ( [phone] )  [facility] []  ( [phone] )  [facility] []  ( [phone]  [place] []  ( [phone] )  [facility] [x]  ( [phone] )  [facility]  

## 2020-01-21 DIAGNOSIS — J069 Acute upper respiratory infection, unspecified: Secondary | ICD-10-CM | POA: Diagnosis not present

## 2020-01-21 DIAGNOSIS — U071 COVID-19: Secondary | ICD-10-CM | POA: Diagnosis not present

## 2020-01-21 LAB — CBC WITH DIFFERENTIAL/PLATELET
Abs Immature Granulocytes: 0.26 10*3/uL — ABNORMAL HIGH (ref 0.00–0.07)
Basophils Absolute: 0 10*3/uL (ref 0.0–0.1)
Basophils Relative: 0 %
Eosinophils Absolute: 0 10*3/uL (ref 0.0–0.5)
Eosinophils Relative: 0 %
HCT: 35.9 % — ABNORMAL LOW (ref 36.0–46.0)
Hemoglobin: 11.6 g/dL — ABNORMAL LOW (ref 12.0–15.0)
Immature Granulocytes: 2 %
Lymphocytes Relative: 8 %
Lymphs Abs: 1 10*3/uL (ref 0.7–4.0)
MCH: 25.7 pg — ABNORMAL LOW (ref 26.0–34.0)
MCHC: 32.3 g/dL (ref 30.0–36.0)
MCV: 79.4 fL — ABNORMAL LOW (ref 80.0–100.0)
Monocytes Absolute: 0.5 10*3/uL (ref 0.1–1.0)
Monocytes Relative: 4 %
Neutro Abs: 9.8 10*3/uL — ABNORMAL HIGH (ref 1.7–7.7)
Neutrophils Relative %: 86 %
Platelets: 312 10*3/uL (ref 150–400)
RBC: 4.52 MIL/uL (ref 3.87–5.11)
RDW: 17.1 % — ABNORMAL HIGH (ref 11.5–15.5)
WBC: 11.6 10*3/uL — ABNORMAL HIGH (ref 4.0–10.5)
nRBC: 0 % (ref 0.0–0.2)

## 2020-01-21 LAB — GLUCOSE, CAPILLARY
Glucose-Capillary: 141 mg/dL — ABNORMAL HIGH (ref 70–99)
Glucose-Capillary: 78 mg/dL (ref 70–99)
Glucose-Capillary: 86 mg/dL (ref 70–99)
Glucose-Capillary: 130 mg/dL — ABNORMAL HIGH (ref 70–99)
Glucose-Capillary: 148 mg/dL — ABNORMAL HIGH (ref 70–99)

## 2020-01-21 LAB — COMPREHENSIVE METABOLIC PANEL
ALT: 37 U/L (ref 0–44)
AST: 42 U/L — ABNORMAL HIGH (ref 15–41)
Albumin: 2.3 g/dL — ABNORMAL LOW (ref 3.5–5.0)
Alkaline Phosphatase: 81 U/L (ref 38–126)
Anion gap: 10 (ref 5–15)
BUN: 37 mg/dL — ABNORMAL HIGH (ref 8–23)
CO2: 22 mmol/L (ref 22–32)
Calcium: 8.9 mg/dL (ref 8.9–10.3)
Chloride: 110 mmol/L (ref 98–111)
Creatinine, Ser: 1.28 mg/dL — ABNORMAL HIGH (ref 0.44–1.00)
GFR calc Af Amer: 46 mL/min — ABNORMAL LOW (ref >60)
GFR calc non Af Amer: 40 mL/min — ABNORMAL LOW (ref >60)
Glucose, Bld: 75 mg/dL (ref 70–99)
Potassium: 3.8 mmol/L (ref 3.5–5.1)
Sodium: 142 mmol/L (ref 135–145)
Total Bilirubin: 0.8 mg/dL (ref 0.3–1.2)
Total Protein: 6.5 g/dL (ref 6.5–8.1)

## 2020-01-21 LAB — D-DIMER, QUANTITATIVE: D-Dimer, Quant: 2.16 ug/mL-FEU — ABNORMAL HIGH (ref 0.00–0.50)

## 2020-01-21 LAB — C-REACTIVE PROTEIN: CRP: 9.1 mg/dL — ABNORMAL HIGH (ref <1.0)

## 2020-01-21 MED ORDER — LOSARTAN POTASSIUM 25 MG PO TABS
25.0000 mg | ORAL_TABLET | Freq: Every day | ORAL | Status: DC
  Administered 2020-01-21 – 2020-01-25 (×5): 25 mg via ORAL
  Filled 2020-01-21 (×5): qty 1

## 2020-01-21 MED ORDER — METOPROLOL TARTRATE 12.5 MG HALF TABLET
12.5000 mg | ORAL_TABLET | Freq: Two times a day (BID) | ORAL | Status: DC
  Administered 2020-01-21: 12.5 mg via ORAL
  Filled 2020-01-21: qty 1

## 2020-01-21 NOTE — Progress Notes (Signed)
Numerous family members calling the unit to speak with nurse and MD regarding pt status.  Pt daughter Christy Hanson is currently the only family member that is local and is the primary contact for the patient.  Pt daughter [name] of [place] requested to speak with nursing leadership regarding concern of not being able to obtain information about her mother.  Mackensey Bolte RN Probation officer and apologized for any miscommunication, however all communication regarding her mothers medical care would go through sister Christy Hanson and MD has already spoken with her today.  Montras verbalized understanding and will follow up with her sister regarding update on mother. Montras assured that pt stable at this time and resting comfortably in bed.

## 2020-01-21 NOTE — Care Management Important Message (Signed)
Important Message  Patient Details IM Letter given to the Patient Name: Christy Hanson MRN: [id number] Date of Birth: September 20, 1940   Medicare Important Message Given:  Yes     Caren Macadam 01/21/2020, 8:54 AM

## 2020-01-21 NOTE — Progress Notes (Signed)
PROGRESS NOTE  Christy Hanson  DOB: 07/11/1940  [facility] WUJ:[id number]  DOA: 01/18/2020  LOS: 3 days   Chief Complaint  Patient presents with  . Shortness of Breath  . Covid Exposure   Brief narrative: Christy Hanson is a 79 y.o. female with PMH of DM2, HTN, HLD, chronic diastolic CHF, CKD stage III, hypothyroidism. Patient presented to the ED on 01/18/2020 with progressively worsening shortness of breath, fatigue for last 4 days after exposure to Covid infection at home.  In the ER, patient was afebrile, heart rate 126, tachypneic to maintain oxygen saturation on room air, initial blood pressure was 143/76.  Over the next few hours, patient became more tachypneic and blood pressure plummeted down to 70/59. WBC normal at 9.5, lactic acid elevated to 3.4. Labs with creatinine elevated to 1.95, AST/ALT 73/54 COVID-19 PCR positive. LDH 414, ferritin 315, CRP 25, D-dimer 2.54, procalcitonin elevated 0.99 Chest x-ray showed bibasilar airspace opacities. Blood cultures were sent.  Patient was admitted to hospitalist service for further evaluation management.  Subjective: Patient was seen and examined this morning. Lying in bed.  Alert, awake.  On 4 L oxygen by nasal cannula at rest. Feels better. Poor appetite and her blood glucose level is running low. She is oriented to place and person not to time.  And is slow to respond.  Her daughter also noticed earlier that she was giving inconsistent answers. Chart reviewed. Labs today with creatinine slightly worse at 1.28, CRP down at 9.1.  D-dimer up.  Assessment/Plan: COVID pneumonia Acute respiratory failure with hypoxia -Presented with worsening shortness of breath, fatigue -COVID test: PCR positive -Chest imaging: Bibasilar airspace opacities  -Treatment: Currently on a 5-day course of IV remdesivir to complete on 8/31, IV Solu-Medrol 50 mg daily.  Not requiring Actemra or baricitinib. -Oxygen - SpO2: 91  % O2 Flow Rate (L/min): 4 L/min  -Progression: Feels better.  Remains on 4 L oxygen at rest.  Not was mobile.  And family markers improving. -Continue IV remdesivir and IV Solu-Medrol. -Protonix while on steroids. -Supportive care: Vitamin C, Zinc, PRN inhalers, Tylenol, Antitussives (benzonatate/ Mucinex/Tussionex).   -Encouraged incentive spirometry, out of bed and early mobilization as much as possible -Continue airborne/contact isolation precautions for duration of 3 weeks from the day of diagnosis. -WBC and inflammatory markers trend as below.  Lab Results  Component Value Date   SARSCOV2NAA POSITIVE (A) 01/18/2020    Recent Labs  Lab 01/18/20 2127 01/18/20 2228 01/19/20 0129 01/19/20 0618 01/20/20 1245 01/21/20 0434  WBC 9.5  --   --  7.3 10.3 11.6*  LATICACIDVEN 3.4*  --  1.7 1.2  --   --   PROCALCITON  --  0.99  --   --   --   --   DDIMER 2.54*  --   --   --  1.93* 2.16*  FERRITIN  --  315*  --   --   --   --   LDH  --  414*  --   --   --   --   CRP  --  24.8*  --   --  13.5* 9.1*  ALT  --  54*  --   --  39 37   The treatment plan and use of medications and known side effects were discussed with patient/family. Some of the medications used are based on case reports/anecdotal data.  All other medications being used in the management of COVID-19 based on limited study data.  Complete risks and long-term side effects are unknown, however in the best clinical judgment they seem to be of some benefit.  Patient wanted to proceed with treatment options provided.  Suspected secondary bacterial infection Sepsis with hypotension - POA  -On top of COVID-19 pneumonia, patient probably has underlying pectoral pneumonia as well. -Procalcitonin level is elevated to 0.99, lactic acid level was initially elevated to 3.4.  -Blood cultures sent. -Blood pressure dipped to 70s at one point, improved with IV fluid now. -Lactic acid level improved as well. -Continue IV Rocephin and IV  azithromycin, QTC 430 ms. -Repeat procalcitonin level tomorrow.  Diabetes mellitus type 2 -Home meds listed Levemir 40 units twice daily, Metformin 1000 mg twice daily, -Levemir resumed at home dose.  Metformin was kept on hold.  With IV steroids, we are expecting her blood sugar to run high.  However is running low.  Daughter states to me today that patient was not consistent with the use of insulin.  She believes she was taking Levemir only once a day.   -For now, I stopped Levemir.  I would continue sliding scale insulin only.  Once blood sugar picks up, we can resume insulin slowly.  Lab Results  Component Value Date   HGBA1C 7.6 (H) 01/19/2020   Recent Labs  Lab 01/20/20 1734 01/20/20 1803 01/20/20 2117 01/21/20 0752 01/21/20 1130  GLUCAP 62* 73 99 78 86   CKD stage III -Creatinine at baseline. Recent Labs    11/13/19 1520 01/18/20 2228 01/19/20 0618 01/20/20 1245 01/21/20 0434  CREATININE 1.45* 1.95* 1.42* 1.17* 1.28*   Chronic diastolic CHF Hypertension -Home meds include Metoprolol 100 mg twice daily, losartan 25 mg daily, Lasix 40 mg daily as needed, -Metoprolol has been resumed.  She is having bradycardic episodes.  I would reduce the dose of metoprolol.  Blood pressure is mostly elevated, I would resume losartan 25 mg daily.  History of GI bleed  -underwent EGD in March 2021 showed peptic ulcer disease. Currently on Protonix and iron supplements  Acute metabolic encephalopathy/generalized weakness -Patient is slow to respond, intermittently confused and generalized weakness. -I think is partly because of Covid, probably underlying dementia based on her risk factors, IV steroids and hospitalization.  Patient daughter also told me that patient was not on Seroquel at home as listed in the home list.  She has been getting Seroquel in the hospital which probably is also contributing to encephalopathy.  I would stop Seroquel. -Continue to monitor mental  status.  Mobility: Encourage ambulation Code Status:   Code Status: Full Code  Nutritional status: Body mass index is 37.46 kg/m.     Diet Order            Diet heart healthy/carb modified Room service appropriate? Yes; Fluid consistency: Thin; Fluid restriction: 1200 mL Fluid  Diet effective now                 DVT prophylaxis: enoxaparin (LOVENOX) injection 40 mg Start: 01/20/20 1000   Antimicrobials:  IV Rocephin, IV azithromycin Fluid: None  Consultants: None Family Communication:  None  Status is: Inpatient  Remains inpatient appropriate because -ongoing treatment for Covid with IV medicines, oxygen dependence, acute encephalopathy, pending PT evaluation  Dispo: The patient is from: Home              Anticipated d/c is to: Home              Anticipated d/c date is: In next 1 to 2 days  Patient currently is not medically stable to d/c.       Infusions:  . cefTRIAXone (ROCEPHIN)  IV 1 g (01/21/20 1215)  . remdesivir 100 mg in NS 100 mL 100 mg (01/21/20 0844)    Scheduled Meds: . vitamin C  500 mg Oral Daily  . azithromycin  500 mg Oral Daily  . brimonidine  1 drop Both Eyes BID  . dorzolamide-timolol  1 drop Both Eyes BID  . enoxaparin (LOVENOX) injection  40 mg Subcutaneous Q24H  . ferrous sulfate  325 mg Oral Q breakfast  . insulin aspart  0-9 Units Subcutaneous TID WC  . latanoprost  1 drop Both Eyes QHS  . methylPREDNISolone (SOLU-MEDROL) injection  0.5 mg/kg Intravenous Q12H  . metoprolol tartrate  100 mg Oral BID  . pantoprazole (PROTONIX) IV  40 mg Intravenous Q24H  . zinc sulfate  220 mg Oral Daily    Antimicrobials: Anti-infectives (From admission, onward)   Start     Dose/Rate Route Frequency Ordered Stop   01/20/20 1000  azithromycin (ZITHROMAX) tablet 500 mg        500 mg Oral Daily 01/20/20 0817     01/19/20 1000  remdesivir 100 mg in sodium chloride 0.9 % 100 mL IVPB        100 mg 200 mL/hr over 30 Minutes Intravenous  Daily 01/18/20 2316 01/23/20 0959   01/19/20 1000  cefTRIAXone (ROCEPHIN) 1 g in sodium chloride 0.9 % 100 mL IVPB        1 g 200 mL/hr over 30 Minutes Intravenous Every 24 hours 01/19/20 0836     01/19/20 1000  azithromycin (ZITHROMAX) 500 mg in sodium chloride 0.9 % 250 mL IVPB  Status:  Discontinued        500 mg 250 mL/hr over 60 Minutes Intravenous Every 24 hours 01/19/20 0836 01/20/20 0817   01/18/20 2330  remdesivir 200 mg in sodium chloride 0.9% 250 mL IVPB        200 mg 580 mL/hr over 30 Minutes Intravenous Once 01/18/20 2316 01/19/20 0004      PRN meds: chlorpheniramine-HYDROcodone, guaiFENesin-dextromethorphan, hydrALAZINE, ondansetron **OR** ondansetron (ZOFRAN) IV   Objective: Vitals:   01/21/20 0421 01/21/20 0513  BP:  (!) 149/51  Pulse:  (!) 52  Resp:    Temp:  (!) 97.5 F (36.4 C)  SpO2: 92% 91%    Intake/Output Summary (Last 24 hours) at 01/21/2020 1317 Last data filed at 01/21/2020 1000 Gross per 24 hour  Intake 484.84 ml  Output 364 ml  Net 120.84 ml   Filed Weights   01/18/20 2104 01/21/20 0540  Weight: 97.5 kg 92.9 kg   Weight change:  Body mass index is 37.46 kg/m.   Physical Exam: General exam: Feels better but looks weak not in physical distress Skin: No rashes, lesions or ulcers. HEENT: Atraumatic, normocephalic, supple neck, no obvious bleeding Lungs: Clear to auscultation bilaterally. CVS: Regular rate and rhythm, no murmur GI/Abd soft, nontender, nondistended, bowel sound present CNS: Alert, awake, oriented to place and person, slow to respond.  Able to follow commands. Psychiatry: Mood appropriate Extremities: No pedal edema, no calf tenderness  Data Review: I have personally reviewed the laboratory data and studies available.  Recent Labs  Lab 01/18/20 2127 01/19/20 0618 01/20/20 1245 01/21/20 0434  WBC 9.5 7.3 10.3 11.6*  NEUTROABS 7.5  --  8.6* 9.8*  HGB 13.2 11.9* 11.0* 11.6*  HCT 41.6 37.0 34.2* 35.9*  MCV 79.5* 79.6*  79.0* 79.4*  PLT 248  244 285 312   Recent Labs  Lab 01/18/20 2228 01/19/20 0618 01/20/20 1245 01/21/20 0434  NA 135  --  142 142  K 4.4  --  3.8 3.8  CL 100  --  110 110  CO2 21*  --  22 22  GLUCOSE 161*  --  98 75  BUN 38*  --  35* 37*  CREATININE 1.95* 1.42* 1.17* 1.28*  CALCIUM 8.7*  --  8.6* 8.9   Lab Results  Component Value Date   HGBA1C 7.6 (H) 01/19/2020       Component Value Date/Time   TRIG 188 (H) 01/18/2020 2227    SignedLorin Glass, MD [facility] Pager: [phone] (Secure Chat preferred). 01/21/2020

## 2020-01-21 NOTE — Evaluation (Signed)
Physical Therapy Evaluation Patient Details Name: Christy Hanson MRN: [id number] DOB: [birth year] Today's Date: 01/21/2020   History of Present Illness  79 yo female admitted with COVID, confusion. Hx of DM, CHF, CKD, hypothyroidism  Clinical Impression  On eval, pt required Min assist for mobility. She walked ~10'x2 with use of a RW. O2 86% on 4L  O2. Pt appears confused. She required increased time and repeated cueing for all tasks. She tolerated activity fairly well. Will plan to follow and progress activity as tolerated. D/C plan depends on progress and assistance available at home.     Follow Up Recommendations Home health PT;Supervision/Assistance - 24 hour vs SNF (depending on progress and assistance available in home)    Equipment Recommendations  Rolling walker with 5" wheels    Recommendations for Other Services       Precautions / Restrictions Precautions Precautions: Fall Precaution Comments: monitor O2 Restrictions Weight Bearing Restrictions: No      Mobility  Bed Mobility Overal bed mobility: Needs Assistance Bed Mobility: Supine to Sit;Sit to Supine     Supine to sit: Min assist;HOB elevated Sit to supine: Min assist;HOB elevated   General bed mobility comments: Multimodal, repeated cueing. Increased time. Assist to scoot to EOB and for LEs back onto bed.  Transfers Overall transfer level: Needs assistance Equipment used: Rolling walker (2 wheeled) Transfers: Sit to/from Stand Sit to Stand: Min assist         General transfer comment: Multimodal, repeated cueing. Increased time. Assist to rise, stabilize, control descent.  Ambulation/Gait Ambulation/Gait assistance: Min assist Gait Distance (Feet): 10 Feet (2) Assistive device: Rolling walker (2 wheeled) Gait Pattern/deviations: Step-through pattern;Decreased stride length     General Gait Details: Assist to stabilize pt and maneuver RW. Cues for safety, direction, RW proximity. Slow gait  speed.  Stairs            Wheelchair Mobility    Modified Rankin (Stroke Patients Only)       Balance Overall balance assessment: Needs assistance         Standing balance support: Bilateral upper extremity supported Standing balance-Leahy Scale: Poor                               Pertinent Vitals/Pain Pain Assessment: Faces Faces Pain Scale: No hurt    Home Living Family/patient expects to be discharged to:: Private residence Living Arrangements: Children;Non-relatives/Friends Available Help at Discharge: Family Type of Home: House       Home Layout: One level Home Equipment: Gilmer Mor - single point      Prior Function Level of Independence: Independent               Hand Dominance        Extremity/Trunk Assessment   Upper Extremity Assessment Upper Extremity Assessment: Generalized weakness    Lower Extremity Assessment Lower Extremity Assessment: Generalized weakness    Cervical / Trunk Assessment Cervical / Trunk Assessment: Normal  Communication   Communication: No difficulties  Cognition Arousal/Alertness: Awake/alert Behavior During Therapy: WFL for tasks assessed/performed Overall Cognitive Status: No family/caregiver present to determine baseline cognitive functioning Area of Impairment: Following commands;Orientation;Safety/judgement;Problem solving                 Orientation Level: Disoriented to;Place;Situation;Time     Following Commands: Follows one step commands with increased time Safety/Judgement: Decreased awareness of safety;Decreased awareness of deficits   Problem Solving: Slow processing;Decreased initiation;Requires verbal cues;Requires  tactile cues        General Comments      Exercises     Assessment/Plan    PT Assessment Patient needs continued PT services  PT Problem List Decreased strength;Decreased mobility;Decreased activity tolerance;Decreased balance;Decreased knowledge of use  of DME;Decreased cognition;Decreased safety awareness       PT Treatment Interventions DME instruction;Gait training;Therapeutic activities;Therapeutic exercise;Patient/family education;Functional mobility training;Balance training    PT Goals (Current goals can be found in the Care Plan section)  Acute Rehab PT Goals Patient Stated Goal: none stated PT Goal Formulation: With patient Time For Goal Achievement: 02/04/20 Potential to Achieve Goals: Good    Frequency Min 3X/week   Barriers to discharge        Co-evaluation               AM-PAC PT "6 Clicks" Mobility  Outcome Measure Help needed turning from your back to your side while in a flat bed without using bedrails?: A Little Help needed moving from lying on your back to sitting on the side of a flat bed without using bedrails?: A Little Help needed moving to and from a bed to a chair (including a wheelchair)?: A Little Help needed standing up from a chair using your arms (e.g., wheelchair or bedside chair)?: A Little Help needed to walk in hospital room?: A Little Help needed climbing 3-5 steps with a railing? : A Lot 6 Click Score: 17    End of Session Equipment Utilized During Treatment: Gait belt;Oxygen Activity Tolerance: Patient tolerated treatment well Patient left: in bed;with bed alarm set;with call bell/phone within reach   PT Visit Diagnosis: Muscle weakness (generalized) (M62.81);Difficulty in walking, not elsewhere classified (R26.2)    Time: 8527-7824 PT Time Calculation (min) (ACUTE ONLY): 28 min   Charges:   PT Evaluation $PT Eval Low Complexity: 1 Low PT Treatments $Gait Training: 8-22 mins          Faye Ramsay, PT Acute Rehabilitation  Office: [phone] Pager: [phone]

## 2020-01-22 ENCOUNTER — Inpatient Hospital Stay (HOSPITAL_COMMUNITY): Payer: Medicare Other

## 2020-01-22 DIAGNOSIS — J069 Acute upper respiratory infection, unspecified: Secondary | ICD-10-CM | POA: Diagnosis not present

## 2020-01-22 DIAGNOSIS — U071 COVID-19: Secondary | ICD-10-CM | POA: Diagnosis not present

## 2020-01-22 LAB — D-DIMER, QUANTITATIVE: D-Dimer, Quant: 2.13 ug/mL-FEU — ABNORMAL HIGH (ref 0.00–0.50)

## 2020-01-22 LAB — COMPREHENSIVE METABOLIC PANEL
ALT: 32 U/L (ref 0–44)
AST: 34 U/L (ref 15–41)
Albumin: 2.3 g/dL — ABNORMAL LOW (ref 3.5–5.0)
Alkaline Phosphatase: 78 U/L (ref 38–126)
Anion gap: 13 (ref 5–15)
BUN: 38 mg/dL — ABNORMAL HIGH (ref 8–23)
CO2: 21 mmol/L — ABNORMAL LOW (ref 22–32)
Calcium: 8.9 mg/dL (ref 8.9–10.3)
Chloride: 112 mmol/L — ABNORMAL HIGH (ref 98–111)
Creatinine, Ser: 1.45 mg/dL — ABNORMAL HIGH (ref 0.44–1.00)
GFR calc Af Amer: 40 mL/min — ABNORMAL LOW (ref >60)
GFR calc non Af Amer: 34 mL/min — ABNORMAL LOW (ref >60)
Glucose, Bld: 164 mg/dL — ABNORMAL HIGH (ref 70–99)
Potassium: 4.5 mmol/L (ref 3.5–5.1)
Sodium: 146 mmol/L — ABNORMAL HIGH (ref 135–145)
Total Bilirubin: 0.9 mg/dL (ref 0.3–1.2)
Total Protein: 6.4 g/dL — ABNORMAL LOW (ref 6.5–8.1)

## 2020-01-22 LAB — CBC WITH DIFFERENTIAL/PLATELET
Abs Immature Granulocytes: 0.35 10*3/uL — ABNORMAL HIGH (ref 0.00–0.07)
Basophils Absolute: 0 10*3/uL (ref 0.0–0.1)
Basophils Relative: 0 %
Eosinophils Absolute: 0 10*3/uL (ref 0.0–0.5)
Eosinophils Relative: 0 %
HCT: 36.2 % (ref 36.0–46.0)
Hemoglobin: 11.3 g/dL — ABNORMAL LOW (ref 12.0–15.0)
Immature Granulocytes: 3 %
Lymphocytes Relative: 10 %
Lymphs Abs: 1 10*3/uL (ref 0.7–4.0)
MCH: 25.1 pg — ABNORMAL LOW (ref 26.0–34.0)
MCHC: 31.2 g/dL (ref 30.0–36.0)
MCV: 80.3 fL (ref 80.0–100.0)
Monocytes Absolute: 0.4 10*3/uL (ref 0.1–1.0)
Monocytes Relative: 4 %
Neutro Abs: 8.7 10*3/uL — ABNORMAL HIGH (ref 1.7–7.7)
Neutrophils Relative %: 83 %
Platelets: 272 10*3/uL (ref 150–400)
RBC: 4.51 MIL/uL (ref 3.87–5.11)
RDW: 17.5 % — ABNORMAL HIGH (ref 11.5–15.5)
WBC: 10.6 10*3/uL — ABNORMAL HIGH (ref 4.0–10.5)
nRBC: 0 % (ref 0.0–0.2)

## 2020-01-22 LAB — C-REACTIVE PROTEIN: CRP: 5.6 mg/dL — ABNORMAL HIGH (ref <1.0)

## 2020-01-22 LAB — GLUCOSE, CAPILLARY
Glucose-Capillary: 189 mg/dL — ABNORMAL HIGH (ref 70–99)
Glucose-Capillary: 174 mg/dL — ABNORMAL HIGH (ref 70–99)
Glucose-Capillary: 219 mg/dL — ABNORMAL HIGH (ref 70–99)
Glucose-Capillary: 240 mg/dL — ABNORMAL HIGH (ref 70–99)

## 2020-01-22 LAB — PROCALCITONIN: Procalcitonin: 0.22 ng/mL

## 2020-01-22 MED ORDER — AMLODIPINE BESYLATE 5 MG PO TABS
5.0000 mg | ORAL_TABLET | Freq: Every day | ORAL | Status: DC
  Administered 2020-01-22 – 2020-01-25 (×4): 5 mg via ORAL
  Filled 2020-01-22 (×4): qty 1

## 2020-01-22 MED ORDER — INSULIN GLARGINE 100 UNIT/ML ~~LOC~~ SOLN
10.0000 [IU] | Freq: Every day | SUBCUTANEOUS | Status: DC
  Administered 2020-01-22 – 2020-01-24 (×3): 10 [IU] via SUBCUTANEOUS
  Filled 2020-01-22 (×3): qty 0.1

## 2020-01-22 NOTE — TOC Initial Note (Signed)
Transition of Care [facility]) - Initial/Assessment Note    Patient Details  Name: Christy Hanson MRN: [id number] Date of Birth: 1940-09-22  [facility] CM/SW Contact:    Ida Rogue, LCSW Phone Number: 01/22/2020, 1:26 PM  Clinical Narrative:   Followed up with patient family in response to PT recommendation of [facility] PT/24 hour supervision.  Called daughter Argelia, [place]  [phone], who conferenced in 2 of her siblings.  I let them know that although I was calling them about d/c planning, it did not mean that her discharge was imminent.  We just need clarification of plan.  I explained differences between rehab and [facility] services. Family was quick to say they wanted her home-that she would be staying with her daughter at [address] [place], and that daughter Kennyth Lose, who is working from home, would have help from other family members.  Answered all questions I was able to answer; deferred ones I couldn't to MD. Jovita Gamma them my phone number for further questions as they arise. Will refer patient for [facility] services. TOC will continue to follow during the course of hospitalization.               Expected Discharge Plan: Home w Home Health Services Barriers to Discharge: No Barriers Identified   Patient Goals and CMS Choice     Choice offered to / list presented to : Adult Children  Expected Discharge Plan and Services Expected Discharge Plan: Home w Home Health Services   Discharge Planning Services: CM Consult Post Acute Care Choice: Home Health Living arrangements for the past 2 months: Apartment                                      Prior Living Arrangements/Services Living arrangements for the past 2 months: Apartment Lives with:: Self Patient language and need for interpreter reviewed:: Yes        Need for Family Participation in Patient Care: Yes (Comment) Care giver support system in place?: Yes (comment) Current home services: DME Criminal  Activity/Legal Involvement Pertinent to Current Situation/Hospitalization: No - Comment as needed  Activities of Daily Living Home Assistive Devices/Equipment: Walker (specify type) ADL Screening (condition at time of admission) Patient's cognitive ability adequate to safely complete daily activities?: No Is the patient deaf or have difficulty hearing?: No Does the patient have difficulty seeing, even when wearing glasses/contacts?: No Does the patient have difficulty concentrating, remembering, or making decisions?: No Patient able to express need for assistance with ADLs?: No Does the patient have difficulty dressing or bathing?: No Independently performs ADLs?: No Bathing: Needs assistance Is this a change from baseline?: Change from baseline, expected to last <3 days Does the patient have difficulty walking or climbing stairs?: Yes Weakness of Legs: Both Weakness of Arms/Hands: None  Permission Sought/Granted Permission sought to share information with : Family Supports Permission granted to share information with : No  Share Information with NAME: daughter, daughter, son           Emotional Assessment       Orientation: : Oriented to Self Alcohol / Substance Use: Not Applicable Psych Involvement: No (comment)  Admission diagnosis:  Hypoxia [R09.02] Dyspnea, unspecified type [R06.00] Acute respiratory disease due to COVID-19 virus [U07.1, J06.9] Patient Active Problem List   Diagnosis Date Noted  . DM (diabetes mellitus), type 2 with renal complications (HCC) 01/19/2020  . Essential hypertension 01/19/2020  .  CKD (chronic kidney disease), stage III 01/19/2020  . Acute respiratory disease due to COVID-19 virus 01/18/2020   PCP:  [facility]:   [id number] [place], [place] - [address] AT [facility] OF [facility] [address] [place] [address] Phone: [phone] Fax: [phone]     Social  Determinants of Health (SDOH) Interventions    Readmission Risk Interventions No flowsheet data found.

## 2020-01-22 NOTE — Progress Notes (Signed)
PROGRESS NOTE  Christy Hanson  DOB: 06/23/1940  PCP: [facility] WVP:[id number]  DOA: 01/18/2020  LOS: 4 days   Chief Complaint  Patient presents with  . Shortness of Breath  . Covid Exposure   Brief narrative: Christy Hanson is a 79 y.o. female with PMH of DM2, HTN, HLD, chronic diastolic CHF, CKD stage III, hypothyroidism. Patient presented to the ED on 01/18/2020 with progressively worsening shortness of breath, fatigue for last 4 days after exposure to Covid infection at home.  In the ER, patient was afebrile, heart rate 126, tachypneic to maintain oxygen saturation on room air, initial blood pressure was 143/76.  Over the next few hours, patient became more tachypneic and blood pressure plummeted down to 70/59. WBC normal at 9.5, lactic acid elevated to 3.4. Labs with creatinine elevated to 1.95, AST/ALT 73/54 COVID-19 PCR positive. LDH 414, ferritin 315, CRP 25, D-dimer 2.54, procalcitonin elevated 0.99 Chest x-ray showed bibasilar airspace opacities. Blood cultures were sent.  Patient was admitted to hospitalist service for further evaluation management.  Subjective: Patient was seen and examined this morning. Lying in bed.  Feels the same.  On 4 L oxygen by nasal cannula. She is oriented to place and person not to time.   Assessment/Plan: COVID pneumonia Acute respiratory failure with hypoxia -Presented with worsening shortness of breath, fatigue -COVID test: PCR positive -Chest imaging: Bibasilar airspace opacities  -Treatment: Completed 5-day course of IV remdesivir today. Currently on IV Solu-Medrol 50 mg daily.  Not requiring Actemra or baricitinib. -Oxygen - SpO2: (!) 88 % O2 Flow Rate (L/min): 4 L/min  -Progression: Patient states she feels better.  CRP improving.  Remains on 4 L oxygen at rest.  Required higher flow on ambulation with physical therapy yesterday. Repeat chest x-ray today. -Continue IV Solu-Medrol. -Protonix while on  steroids. -Supportive care: Vitamin C, Zinc, PRN inhalers, Tylenol, Antitussives (benzonatate/ Mucinex/Tussionex).   -Encouraged incentive spirometry, out of bed and early mobilization as much as possible -Continue airborne/contact isolation precautions for duration of 3 weeks from the day of diagnosis. -WBC and inflammatory markers trend as below.  Lab Results  Component Value Date   SARSCOV2NAA POSITIVE (A) 01/18/2020    Recent Labs  Lab 01/18/20 2127 01/18/20 2228 01/19/20 0129 01/19/20 0618 01/20/20 1245 01/21/20 0434 01/22/20 0332  WBC 9.5  --   --  7.3 10.3 11.6* 10.6*  LATICACIDVEN 3.4*  --  1.7 1.2  --   --   --   PROCALCITON  --  0.99  --   --   --   --  0.22  DDIMER 2.54*  --   --   --  1.93* 2.16* 2.13*  FERRITIN  --  315*  --   --   --   --   --   LDH  --  414*  --   --   --   --   --   CRP  --  24.8*  --   --  13.5* 9.1* 5.6*  ALT  --  54*  --   --  39 37 32   The treatment plan and use of medications and known side effects were discussed with patient/family. Some of the medications used are based on case reports/anecdotal data.  All other medications being used in the management of COVID-19 based on limited study data.  Complete risks and long-term side effects are unknown, however in the best clinical judgment they seem to be of some benefit.  Patient wanted to  proceed with treatment options provided.  Suspected secondary bacterial infection Sepsis with hypotension - POA  -On top of COVID-19 pneumonia, patient probably has underlying pectoral pneumonia as well. -Procalcitonin level is elevated to 0.99, lactic acid level was initially elevated to 3.4.  -Blood cultures sent. -Blood pressure dipped to 70s at one point, improved with IV fluid now. -Lactic acid level improved as well. -Continue IV Rocephin and IV azithromycin, QTC 430 ms. -Repeat procalcitonin today shows improvement from 0.99 to 0.22.  Diabetes mellitus type 2 - A1c 7.6 on 8/28. -Patient was  inconsistently compliant with her home meds including insulin.   -Currently on sliding scale insulin only.  Blood sugar trending up this morning, I started on Lantus 10 units this morning. Recent Labs  Lab 01/21/20 1130 01/21/20 1637 01/21/20 2055 01/22/20 0739 01/22/20 1121  GLUCAP 86 130* 148* 189* 174*   CKD stage III -Creatinine at baseline. Recent Labs    11/13/19 1520 01/18/20 2228 01/19/20 0618 01/20/20 1245 01/21/20 0434 01/22/20 0332  CREATININE 1.45* 1.95* 1.42* 1.17* 1.28* 1.45*   Chronic diastolic CHF Hypertension -Not sure if patient was compliant but home meds included Metoprolol 100 mg twice daily, losartan 25 mg daily, Lasix 40 mg daily as needed. -Because of bradycardic episodes, I stopped metoprolol this morning.   -Blood pressure this morning is elevated to 170s.  Continue losartan 25 mg daily.  Add amlodipine 5 mg daily. Continue to hold Lasix.  Continue to monitor blood pressure.  Acute hypernatremia Sodium level is gradually trending up since admission, above the range at 146 this morning.  Patient has poor oral intake.  Encourage free water intake.  Lasix remains on hold. Recent Labs  Lab 01/18/20 2228 01/20/20 1245 01/21/20 0434 01/22/20 0332  NA 135 142 142 146*   History of GI bleed  -underwent EGD in March 2021 showed peptic ulcer disease. Currently on Protonix and iron supplements  Acute metabolic encephalopathy/generalized weakness -Patient is slow to respond, intermittently confused and generalized weakness. -I think is partly because of Covid, probably underlying dementia based on her risk factors, IV steroids and hospitalization.  Seroquel stopped.  -Continue to monitor mental status.  Mobility: Encourage ambulation Code Status:   Code Status: Full Code  Nutritional status: Body mass index is 37.46 kg/m.     Diet Order            Diet heart healthy/carb modified Room service appropriate? Yes; Fluid consistency: Thin  Diet  effective now                 DVT prophylaxis: enoxaparin (LOVENOX) injection 40 mg Start: 01/20/20 1000   Antimicrobials:  IV Rocephin, IV azithromycin Fluid: None  Consultants: None Family Communication:  I called and updated patient's daughters this afternoon.  Status is: Inpatient  Remains inpatient appropriate because -ongoing treatment for Covid with IV medicines, oxygen dependence, acute encephalopathy, pending PT evaluation  Dispo: The patient is from: Home              Anticipated d/c is to: Home with home health PT              Anticipated d/c date is: Hopefully tomorrow              Patient currently is not medically stable to d/c.   Infusions:  . cefTRIAXone (ROCEPHIN)  IV 1 g (01/21/20 1215)    Scheduled Meds: . amLODipine  5 mg Oral Daily  . vitamin C  500 mg Oral  Daily  . azithromycin  500 mg Oral Daily  . brimonidine  1 drop Both Eyes BID  . dorzolamide-timolol  1 drop Both Eyes BID  . enoxaparin (LOVENOX) injection  40 mg Subcutaneous Q24H  . ferrous sulfate  325 mg Oral Q breakfast  . insulin aspart  0-9 Units Subcutaneous TID WC  . insulin glargine  10 Units Subcutaneous Daily  . latanoprost  1 drop Both Eyes QHS  . losartan  25 mg Oral Daily  . methylPREDNISolone (SOLU-MEDROL) injection  0.5 mg/kg Intravenous Q12H  . pantoprazole (PROTONIX) IV  40 mg Intravenous Q24H  . zinc sulfate  220 mg Oral Daily    Antimicrobials: Anti-infectives (From admission, onward)   Start     Dose/Rate Route Frequency Ordered Stop   01/20/20 1000  azithromycin (ZITHROMAX) tablet 500 mg        500 mg Oral Daily 01/20/20 0817     01/19/20 1000  remdesivir 100 mg in sodium chloride 0.9 % 100 mL IVPB        100 mg 200 mL/hr over 30 Minutes Intravenous Daily 01/18/20 2316 01/22/20 1352   01/19/20 1000  cefTRIAXone (ROCEPHIN) 1 g in sodium chloride 0.9 % 100 mL IVPB        1 g 200 mL/hr over 30 Minutes Intravenous Every 24 hours 01/19/20 0836     01/19/20 1000   azithromycin (ZITHROMAX) 500 mg in sodium chloride 0.9 % 250 mL IVPB  Status:  Discontinued        500 mg 250 mL/hr over 60 Minutes Intravenous Every 24 hours 01/19/20 0836 01/20/20 0817   01/18/20 2330  remdesivir 200 mg in sodium chloride 0.9% 250 mL IVPB        200 mg 580 mL/hr over 30 Minutes Intravenous Once 01/18/20 2316 01/19/20 0004      PRN meds: chlorpheniramine-HYDROcodone, guaiFENesin-dextromethorphan, hydrALAZINE, ondansetron **OR** ondansetron (ZOFRAN) IV   Objective: Vitals:   01/22/20 1100 01/22/20 1430  BP:  (!) 158/78  Pulse:  78  Resp:  (!) 23  Temp:  98 F (36.7 C)  SpO2: 90% (!) 88%    Intake/Output Summary (Last 24 hours) at 01/22/2020 1623 Last data filed at 01/22/2020 1500 Gross per 24 hour  Intake 1205.16 ml  Output 150 ml  Net 1055.16 ml   Filed Weights   01/18/20 2104 01/21/20 0540  Weight: 97.5 kg 92.9 kg   Weight change:  Body mass index is 37.46 kg/m.   Physical Exam: General exam: Feels better.  Not in distress Skin: No rashes, lesions or ulcers. HEENT: Atraumatic, normocephalic, supple neck, no obvious bleeding Lungs: Clear to auscultate bilaterally CVS: Regular rate and rhythm, no murmur. GI/Abd soft, nontender, nondistended, bowel sound present CNS: Alert, awake, oriented to place and person, slow to respond.  Able to follow commands. Psychiatry: Mood appropriate Extremities: No pedal edema, no calf tenderness  Data Review: I have personally reviewed the laboratory data and studies available.  Recent Labs  Lab 01/18/20 2127 01/19/20 0618 01/20/20 1245 01/21/20 0434 01/22/20 0332  WBC 9.5 7.3 10.3 11.6* 10.6*  NEUTROABS 7.5  --  8.6* 9.8* 8.7*  HGB 13.2 11.9* 11.0* 11.6* 11.3*  HCT 41.6 37.0 34.2* 35.9* 36.2  MCV 79.5* 79.6* 79.0* 79.4* 80.3  PLT 248 244 285 312 272   Recent Labs  Lab 01/18/20 2228 01/19/20 0618 01/20/20 1245 01/21/20 0434 01/22/20 0332  NA 135  --  142 142 146*  K 4.4  --  3.8 3.8 4.5  CL 100   --  110 110 112*  CO2 21*  --  22 22 21*  GLUCOSE 161*  --  98 75 164*  BUN 38*  --  35* 37* 38*  CREATININE 1.95* 1.42* 1.17* 1.28* 1.45*  CALCIUM 8.7*  --  8.6* 8.9 8.9   Lab Results  Component Value Date   HGBA1C 7.6 (H) 01/19/2020       Component Value Date/Time   TRIG 188 (H) 01/18/2020 2227    SignedLorin Glass, MD [facility] Pager: [phone] (Secure Chat preferred). 01/22/2020

## 2020-01-23 DIAGNOSIS — E11649 Type 2 diabetes mellitus with hypoglycemia without coma: Secondary | ICD-10-CM | POA: Diagnosis not present

## 2020-01-23 DIAGNOSIS — Z833 Family history of diabetes mellitus: Secondary | ICD-10-CM | POA: Diagnosis not present

## 2020-01-23 DIAGNOSIS — E7800 Pure hypercholesterolemia, unspecified: Secondary | ICD-10-CM | POA: Diagnosis not present

## 2020-01-23 DIAGNOSIS — Z79899 Other long term (current) drug therapy: Secondary | ICD-10-CM | POA: Diagnosis not present

## 2020-01-23 DIAGNOSIS — G9341 Metabolic encephalopathy: Secondary | ICD-10-CM | POA: Diagnosis not present

## 2020-01-23 DIAGNOSIS — I5032 Chronic diastolic (congestive) heart failure: Secondary | ICD-10-CM | POA: Diagnosis not present

## 2020-01-23 DIAGNOSIS — J069 Acute upper respiratory infection, unspecified: Secondary | ICD-10-CM | POA: Diagnosis not present

## 2020-01-23 DIAGNOSIS — U071 COVID-19: Secondary | ICD-10-CM | POA: Diagnosis present

## 2020-01-23 DIAGNOSIS — N1831 Chronic kidney disease, stage 3a: Secondary | ICD-10-CM | POA: Diagnosis not present

## 2020-01-23 DIAGNOSIS — Z781 Physical restraint status: Secondary | ICD-10-CM | POA: Diagnosis not present

## 2020-01-23 DIAGNOSIS — J1282 Pneumonia due to coronavirus disease 2019: Secondary | ICD-10-CM | POA: Diagnosis not present

## 2020-01-23 DIAGNOSIS — E1122 Type 2 diabetes mellitus with diabetic chronic kidney disease: Secondary | ICD-10-CM | POA: Diagnosis not present

## 2020-01-23 DIAGNOSIS — I130 Hypertensive heart and chronic kidney disease with heart failure and stage 1 through stage 4 chronic kidney disease, or unspecified chronic kidney disease: Secondary | ICD-10-CM | POA: Diagnosis not present

## 2020-01-23 DIAGNOSIS — E870 Hyperosmolality and hypernatremia: Secondary | ICD-10-CM | POA: Diagnosis not present

## 2020-01-23 DIAGNOSIS — J9601 Acute respiratory failure with hypoxia: Secondary | ICD-10-CM | POA: Diagnosis not present

## 2020-01-23 DIAGNOSIS — K279 Peptic ulcer, site unspecified, unspecified as acute or chronic, without hemorrhage or perforation: Secondary | ICD-10-CM | POA: Diagnosis not present

## 2020-01-23 DIAGNOSIS — Z794 Long term (current) use of insulin: Secondary | ICD-10-CM | POA: Diagnosis not present

## 2020-01-23 DIAGNOSIS — E785 Hyperlipidemia, unspecified: Secondary | ICD-10-CM | POA: Diagnosis not present

## 2020-01-23 DIAGNOSIS — E039 Hypothyroidism, unspecified: Secondary | ICD-10-CM | POA: Diagnosis not present

## 2020-01-23 LAB — CBC WITH DIFFERENTIAL/PLATELET
Abs Immature Granulocytes: 0.55 10*3/uL — ABNORMAL HIGH (ref 0.00–0.07)
Basophils Absolute: 0 10*3/uL (ref 0.0–0.1)
Basophils Relative: 0 %
Eosinophils Absolute: 0 10*3/uL (ref 0.0–0.5)
Eosinophils Relative: 0 %
HCT: 38 % (ref 36.0–46.0)
Hemoglobin: 12.1 g/dL (ref 12.0–15.0)
Immature Granulocytes: 5 %
Lymphocytes Relative: 9 %
Lymphs Abs: 1 10*3/uL (ref 0.7–4.0)
MCH: 25.5 pg — ABNORMAL LOW (ref 26.0–34.0)
MCHC: 31.8 g/dL (ref 30.0–36.0)
MCV: 80 fL (ref 80.0–100.0)
Monocytes Absolute: 0.5 10*3/uL (ref 0.1–1.0)
Monocytes Relative: 5 %
Neutro Abs: 8.3 10*3/uL — ABNORMAL HIGH (ref 1.7–7.7)
Neutrophils Relative %: 81 %
Platelets: 291 10*3/uL (ref 150–400)
RBC: 4.75 MIL/uL (ref 3.87–5.11)
RDW: 17.3 % — ABNORMAL HIGH (ref 11.5–15.5)
WBC: 10.4 10*3/uL (ref 4.0–10.5)
nRBC: 0 % (ref 0.0–0.2)

## 2020-01-23 LAB — GLUCOSE, CAPILLARY
Glucose-Capillary: 226 mg/dL — ABNORMAL HIGH (ref 70–99)
Glucose-Capillary: 283 mg/dL — ABNORMAL HIGH (ref 70–99)
Glucose-Capillary: 211 mg/dL — ABNORMAL HIGH (ref 70–99)
Glucose-Capillary: 258 mg/dL — ABNORMAL HIGH (ref 70–99)

## 2020-01-23 LAB — COMPREHENSIVE METABOLIC PANEL
ALT: 27 U/L (ref 0–44)
AST: 23 U/L (ref 15–41)
Albumin: 2.3 g/dL — ABNORMAL LOW (ref 3.5–5.0)
Alkaline Phosphatase: 75 U/L (ref 38–126)
Anion gap: 11 (ref 5–15)
BUN: 39 mg/dL — ABNORMAL HIGH (ref 8–23)
CO2: 19 mmol/L — ABNORMAL LOW (ref 22–32)
Calcium: 8.6 mg/dL — ABNORMAL LOW (ref 8.9–10.3)
Chloride: 110 mmol/L (ref 98–111)
Creatinine, Ser: 1.31 mg/dL — ABNORMAL HIGH (ref 0.44–1.00)
GFR calc Af Amer: 45 mL/min — ABNORMAL LOW (ref >60)
GFR calc non Af Amer: 39 mL/min — ABNORMAL LOW (ref >60)
Glucose, Bld: 248 mg/dL — ABNORMAL HIGH (ref 70–99)
Potassium: 4.1 mmol/L (ref 3.5–5.1)
Sodium: 140 mmol/L (ref 135–145)
Total Bilirubin: 0.7 mg/dL (ref 0.3–1.2)
Total Protein: 6.6 g/dL (ref 6.5–8.1)

## 2020-01-23 LAB — PROCALCITONIN: Procalcitonin: <0.1 ng/mL

## 2020-01-23 LAB — C-REACTIVE PROTEIN: CRP: 3.5 mg/dL — ABNORMAL HIGH (ref <1.0)

## 2020-01-23 LAB — D-DIMER, QUANTITATIVE: D-Dimer, Quant: 2.14 ug/mL-FEU — ABNORMAL HIGH (ref 0.00–0.50)

## 2020-01-23 NOTE — Progress Notes (Signed)
Confused/impulsive.  Pulled out IV, tele monitior, pulse ox, purewick.  Very poor safety awareness, high risk for fall  Initiated bilat wrist restrains at 2210

## 2020-01-23 NOTE — Progress Notes (Signed)
Inpatient Diabetes Program Recommendations  AACE/ADA: New Consensus Statement on Inpatient Glycemic Control (2015)  Target Ranges:  Prepandial:   less than 140 mg/dL      Peak postprandial:   less than 180 mg/dL (1-2 hours)      Critically ill patients:  140 - 180 mg/dL   Lab Results  Component Value Date   GLUCAP 283 (H) 01/23/2020   HGBA1C 7.6 (H) 01/19/2020    Review of Glycemic Control  Diabetes history:  Outpatient Diabetes medications: Lantus 40 units bid, metformin 1000 mg bid Current orders for Inpatient glycemic control:  Lantus 10units QD, Novolog 0-9 units tidwc  On Solumedrol 48.75 mg bid HgbA1C - 7.6% FBS elevated. Needs titration of Lantus.   Inpatient Diabetes Program Recommendations:     Increase Lantus to 15 units bid. Add Novolog 2-3 units tidwc for meal coverage insulin.  Continue to follow.  Thank you. Ailene Ards, RD, LDN, CDE Inpatient Diabetes Coordinator [phone]

## 2020-01-23 NOTE — Progress Notes (Addendum)
Physical Therapy Treatment Patient Details Name: Christy Hanson MRN: [id number] DOB: 10/31/1940 Today's Date: 01/23/2020    History of Present Illness 79 yo female admitted with COVID, confusion. Hx of DM, CHF, CKD, hypothyroidism    PT Comments    Progressing with mobility. O2 91% on RA during session. Some intermittent coughing noted. Pt tolerated activity fairly well.    Follow Up Recommendations  Home health PT;Supervision/Assistance - 24 hour     Equipment Recommendations  Rolling walker with 5" wheels (if pt doesn't have one already)    Recommendations for Other Services       Precautions / Restrictions Precautions Precautions: Fall Precaution Comments: monitor O2 Restrictions Weight Bearing Restrictions: No    Mobility  Bed Mobility Overal bed mobility: Needs Assistance Bed Mobility: Supine to Sit     Supine to sit: Min guard;HOB elevated     General bed mobility comments: Increased time. Repeated cueing required. Min guard for safety  Transfers Overall transfer level: Needs assistance Equipment used: Rolling walker (2 wheeled) Transfers: Sit to/from Stand Sit to Stand: Min guard         General transfer comment: Increased time. Repeated cueing requiring. Min guard for safety.  Ambulation/Gait Ambulation/Gait assistance: Min guard Gait Distance (Feet): 20 Feet (x2) Assistive device: Rolling walker (2 wheeled) Gait Pattern/deviations: Step-through pattern;Decreased stride length     General Gait Details: Min guard for safety. Cues for safety, direction, RW proximity. O2 91% on RA   Stairs             Wheelchair Mobility    Modified Rankin (Stroke Patients Only)       Balance Overall balance assessment: Needs assistance         Standing balance support: Bilateral upper extremity supported Standing balance-Leahy Scale: Poor                              Cognition Arousal/Alertness: Awake/alert Behavior During  Therapy: WFL for tasks assessed/performed Overall Cognitive Status: No family/caregiver present to determine baseline cognitive functioning Area of Impairment: Following commands                       Following Commands: Follows one step commands with increased time     Problem Solving: Slow processing;Requires verbal cues;Requires tactile cues        Exercises      General Comments        Pertinent Vitals/Pain Pain Assessment: No/denies pain    Home Living                      Prior Function            PT Goals (current goals can now be found in the care plan section) Progress towards PT goals: Progressing toward goals    Frequency    Min 3X/week      PT Plan Current plan remains appropriate    Co-evaluation              AM-PAC PT "6 Clicks" Mobility   Outcome Measure  Help needed turning from your back to your side while in a flat bed without using bedrails?: A Little Help needed moving from lying on your back to sitting on the side of a flat bed without using bedrails?: A Little Help needed moving to and from a bed to a chair (including a wheelchair)?: A Little Help needed standing  up from a chair using your arms (e.g., wheelchair or bedside chair)?: A Little Help needed to walk in hospital room?: A Little Help needed climbing 3-5 steps with a railing? : A Little 6 Click Score: 18    End of Session Equipment Utilized During Treatment: Gait belt Activity Tolerance: Patient tolerated treatment well Patient left: in chair;with call bell/phone within reach;with chair alarm set Made secretary aware that pt needed linens changed and a new purewick PT Visit Diagnosis: Muscle weakness (generalized) (M62.81);Difficulty in walking, not elsewhere classified (R26.2)     Time: 7062-3762 PT Time Calculation (min) (ACUTE ONLY): 24 min  Charges:  $Gait Training: 23-37 mins                        Faye Ramsay, PT Acute Rehabilitation   Office: [phone] Pager: [phone]

## 2020-01-23 NOTE — Progress Notes (Signed)
PROGRESS NOTE    Christy Hanson  SHF:[id number] DOB: 04-21-1941 DOA: 01/18/2020 PCP: [facility]    Brief Narrative:  79 year old female with history of type 2 diabetes, hypertension, hyperlipidemia, chronic diastolic congestive heart failure, CKD stage III and hypothyroidism presented to the ER with progressive worsening shortness of breath and fatigue for 4 days after exposure to COVID-19 infection at home.  Admitted on 8/27.  In the emergency room, patient was afebrile, tachycardic and tachypneic.  Dropped blood pressure while in the ER.  Responded to fluid resuscitation.  Lactic acid was 3.4.  COVID-19 PCR positive.  Chest x-ray showed bilateral airspace opacities.   Assessment & Plan:   Principal Problem:   Acute respiratory disease due to COVID-19 virus Active Problems:   DM (diabetes mellitus), type 2 with renal complications (HCC)   Essential hypertension   CKD (chronic kidney disease), stage III  Acute hypoxemic respiratory failure due to COVID-19 pneumonia:  SpO2: 92 % O2 Flow Rate (L/min): 2 L/min   COVID-19 Labs  Recent Labs    01/21/20 0434 01/22/20 0332 01/23/20 0322  DDIMER 2.16* 2.13* 2.14*  CRP 9.1* 5.6* 3.5*    Lab Results  Component Value Date   SARSCOV2NAA POSITIVE (A) 01/18/2020   Continue to monitor due to significant symptoms  chest physiotherapy, incentive spirometry, deep breathing exercises, sputum induction, mucolytic's and bronchodilators. Supplemental oxygen to keep saturations more than 90%. Covid directed therapy with , steroids, on Solu-Medrol remdesivir, completed remdesivir therapy antibiotics, treated for suspected bacterial pneumonia with Rocephin and azithromycin.  She will complete antibiotics therapy today.  Due to severity of symptoms, patient will need daily inflammatory markers, chest x-rays, liver function test to monitor and direct COVID-19 therapies.  Hypertension: Blood pressure stable on current  treatment.  Hypernatremia: Improved with fluid intake.  Acute metabolic encephalopathy: Unknown baseline.  He still confused.  Peptic ulcer disease: On Protonix. Will change   CKD stage IIIa: At about her baseline.    DVT prophylaxis: enoxaparin (LOVENOX) injection 40 mg Start: 01/20/20 1000   Code Status: full code  Family Communication: all 3 children on conference call Disposition Plan: Status is: Inpatient  Remains inpatient appropriate because:Inpatient level of care appropriate due to severity of illness   Dispo: The patient is from: Home              Anticipated d/c is to: Home              Anticipated d/c date is: 2 days              Patient currently is not medically stable to d/c.         Consultants:   None.  Procedures:   None  Antimicrobials:  Antibiotics Given (last 72 hours)    Date/Time Action Medication Dose Rate   01/21/20 0838 Given   azithromycin (ZITHROMAX) tablet 500 mg 500 mg    01/21/20 0844 New Bag/Given   remdesivir 100 mg in sodium chloride 0.9 % 100 mL IVPB 100 mg 200 mL/hr   01/21/20 1215 New Bag/Given   cefTRIAXone (ROCEPHIN) 1 g in sodium chloride 0.9 % 100 mL IVPB 1 g 200 mL/hr   01/22/20 1107 Given   azithromycin (ZITHROMAX) tablet 500 mg 500 mg    01/22/20 1322 New Bag/Given   remdesivir 100 mg in sodium chloride 0.9 % 100 mL IVPB 100 mg 200 mL/hr   01/22/20 1758 New Bag/Given   cefTRIAXone (ROCEPHIN) 1 g in sodium chloride 0.9 % 100  mL IVPB 1 g 200 mL/hr   01/23/20 0300 Given   azithromycin (ZITHROMAX) tablet 500 mg 500 mg    01/23/20 1251 New Bag/Given   cefTRIAXone (ROCEPHIN) 1 g in sodium chloride 0.9 % 100 mL IVPB 1 g 200 mL/hr         Subjective: Patient seen and examined in the morning rounds.  Pleasantly confused.  Had some agitation overnight, however able to be reoriented.  Was on 4 L oxygen.  Objective: Vitals:   01/22/20 2015 01/23/20 0348 01/23/20 0358 01/23/20 1421  BP: (!) 157/79 (!) 181/85  (!)  154/80  Pulse: 67 76  74  Resp: 18 18  19   Temp: 98.1 F (36.7 C) 97.9 F (36.6 C)  97.7 F (36.5 C)  TempSrc:  Oral  Oral  SpO2: 94% 92%  92%  Weight:   92.8 kg   Height:        Intake/Output Summary (Last 24 hours) at 01/23/2020 1459 Last data filed at 01/23/2020 0600 Gross per 24 hour  Intake 363.57 ml  Output 600 ml  Net -236.43 ml   Filed Weights   01/18/20 2104 01/21/20 0540 01/23/20 0358  Weight: 97.5 kg 92.9 kg 92.8 kg    Examination:  General exam: Appears calm and comfortable  Pleasant but confused. Respiratory system: Clear to auscultation. Respiratory effort normal.  No added sounds. Cardiovascular system: S1 & S2 heard, RRR. No JVD, murmurs, rubs, gallops or clicks. No pedal edema. Gastrointestinal system: Abdomen is nondistended, soft and nontender. No organomegaly or masses felt. Normal bowel sounds heard. Extremities: Symmetric 5 x 5 power. Skin: No rashes, lesions or ulcers Psychiatry: Judgement and insight appear compromised.       Data Reviewed: I have personally reviewed following labs and imaging studies  CBC: Recent Labs  Lab 01/18/20 2127 01/18/20 2127 01/19/20 0618 01/20/20 1245 01/21/20 0434 01/22/20 0332 01/23/20 0322  WBC 9.5   < > 7.3 10.3 11.6* 10.6* 10.4  NEUTROABS 7.5  --   --  8.6* 9.8* 8.7* 8.3*  HGB 13.2   < > 11.9* 11.0* 11.6* 11.3* 12.1  HCT 41.6   < > 37.0 34.2* 35.9* 36.2 38.0  MCV 79.5*   < > 79.6* 79.0* 79.4* 80.3 80.0  PLT 248   < > 244 285 312 272 291   < > = values in this interval not displayed.   Basic Metabolic Panel: Recent Labs  Lab 01/18/20 2228 01/18/20 2228 01/19/20 0618 01/20/20 1245 01/21/20 0434 01/22/20 0332 01/23/20 0322  NA 135  --   --  142 142 146* 140  K 4.4  --   --  3.8 3.8 4.5 4.1  CL 100  --   --  110 110 112* 110  CO2 21*  --   --  22 22 21* 19*  GLUCOSE 161*  --   --  98 75 164* 248*  BUN 38*  --   --  35* 37* 38* 39*  CREATININE 1.95*   < > 1.42* 1.17* 1.28* 1.45* 1.31*  CALCIUM  8.7*  --   --  8.6* 8.9 8.9 8.6*   < > = values in this interval not displayed.   GFR: Estimated Creatinine Clearance: 36.9 mL/min (A) (by C-G formula based on SCr of 1.31 mg/dL (H)). Liver Function Tests: Recent Labs  Lab 01/18/20 2228 01/20/20 1245 01/21/20 0434 01/22/20 0332 01/23/20 0322  AST 73* 44* 42* 34 23  ALT 54* 39 37 32 27  ALKPHOS 109  87 81 78 75  BILITOT 1.8* 0.7 0.8 0.9 0.7  PROT 7.3 6.4* 6.5 6.4* 6.6  ALBUMIN 2.7* 2.2* 2.3* 2.3* 2.3*   No results for input(s): LIPASE, AMYLASE in the last 168 hours. No results for input(s): AMMONIA in the last 168 hours. Coagulation Profile: No results for input(s): INR, PROTIME in the last 168 hours. Cardiac Enzymes: No results for input(s): CKTOTAL, CKMB, CKMBINDEX, TROPONINI in the last 168 hours. BNP (last 3 results) No results for input(s): PROBNP in the last 8760 hours. HbA1C: No results for input(s): HGBA1C in the last 72 hours. CBG: Recent Labs  Lab 01/22/20 1121 01/22/20 1659 01/22/20 2017 01/23/20 0927 01/23/20 1153  GLUCAP 174* 219* 240* 226* 283*   Lipid Profile: No results for input(s): CHOL, HDL, LDLCALC, TRIG, CHOLHDL, LDLDIRECT in the last 72 hours. Thyroid Function Tests: No results for input(s): TSH, T4TOTAL, FREET4, T3FREE, THYROIDAB in the last 72 hours. Anemia Panel: No results for input(s): VITAMINB12, FOLATE, FERRITIN, TIBC, IRON, RETICCTPCT in the last 72 hours. Sepsis Labs: Recent Labs  Lab 01/18/20 2127 01/18/20 2228 01/19/20 0129 01/19/20 0618 01/22/20 0332 01/23/20 0322  PROCALCITON  --  0.99  --   --  0.22 <0.10  LATICACIDVEN 3.4*  --  1.7 1.2  --   --     Recent Results (from the past 240 hour(s))  SARS Coronavirus 2 by RT PCR (hospital order, performed in [facility] hospital lab) Nasopharyngeal Nasopharyngeal Swab     Status: Abnormal   Collection Time: 01/18/20  8:59 PM   Specimen: Nasopharyngeal Swab  Result Value Ref Range Status   SARS Coronavirus 2 POSITIVE (A)  NEGATIVE Final    Comment: RESULT CALLED TO, READ BACK BY AND VERIFIED WITH: J,NASH AT 2256 ON 01/18/20 BY A,MOHAMED (NOTE) SARS-CoV-2 target nucleic acids are DETECTED  SARS-CoV-2 RNA is generally detectable in upper respiratory specimens  during the acute phase of infection.  Positive results are indicative  of the presence of the identified virus, but do not rule out bacterial infection or co-infection with other pathogens not detected by the test.  Clinical correlation with patient history and  other diagnostic information is necessary to determine patient infection status.  The expected result is negative.  Fact Sheet for Patients:   BoilerBrush.com.cyhttps://www.fda.gov/media/136312/download   Fact Sheet for Healthcare Providers:   https://pope.com/https://www.fda.gov/media/136313/download    This test is not yet approved or cleared by the [place] FDA and  has been authorized for detection and/or diagnosis of SARS-CoV-2 by FDA under an Emergency Use Authorization (EUA).  This EUA will remain in effect (meaning thi s test can be used) for the duration of  the COVID-19 declaration under Section 564(b)(1) of the Act, 21 U.S.C. section 360-bbb-3(b)(1), unless the authorization is terminated or revoked sooner.  Performed at [facility], [address] [place], [place] [address]   Blood Culture (routine x 2)     Status: None (Preliminary result)   Collection Time: 01/18/20  9:27 PM   Specimen: BLOOD  Result Value Ref Range Status   Specimen Description   Final    BLOOD RIGHT ANTECUBITAL Performed at [facility], [address] [place], [place] [address]    Special Requests   Final    BOTTLES DRAWN AEROBIC AND ANAEROBIC Blood Culture results may not be optimal due to an inadequate volume of blood received in culture bottles Performed at [facility], [address] [place], [place] [address]    Culture  Final    NO GROWTH 4 DAYS Performed  at [facility], [address] [place], [place] [address]    Report Status PENDING  Incomplete  Blood Culture (routine x 2)     Status: None (Preliminary result)   Collection Time: 01/18/20  9:27 PM   Specimen: BLOOD  Result Value Ref Range Status   Specimen Description   Final    BLOOD LEFT ANTECUBITAL Performed at [facility], [address], [place], [place] [address]    Special Requests   Final    BOTTLES DRAWN AEROBIC ONLY Blood Culture results may not be optimal due to an inadequate volume of blood received in culture bottles Performed at [facility], [address] [place], [place] [address]    Culture   Final    NO GROWTH 4 DAYS Performed at [facility], [address] [place], [place] [address]    Report Status PENDING  Incomplete         Radiology Studies: DG CHEST PORT 1 VIEW  Result Date: 01/22/2020 CLINICAL DATA:  Shortness of breath EXAM: PORTABLE CHEST 1 VIEW COMPARISON:  01/18/2020 FINDINGS: Similar appearance of perihilar and basilar airspace disease. No pleural effusion. Stable cardiomediastinal silhouette. No pneumothorax. IMPRESSION: No significant interval change in perihilar and basilar airspace disease presumably representing bilateral pneumonia. Electronically Signed   By: Jasmine Pang M.D.   On: 01/22/2020 19:00        Scheduled Meds: . amLODipine  5 mg Oral Daily  . vitamin C  500 mg Oral Daily  . azithromycin  500 mg Oral Daily  . brimonidine  1 drop Both Eyes BID  . dorzolamide-timolol  1 drop Both Eyes BID  . enoxaparin (LOVENOX) injection  40 mg Subcutaneous Q24H  . ferrous sulfate  325 mg Oral Q breakfast  . insulin aspart  0-9 Units Subcutaneous TID WC  . insulin glargine  10 Units Subcutaneous Daily  . latanoprost  1 drop Both Eyes QHS  . losartan  25 mg Oral Daily  . methylPREDNISolone (SOLU-MEDROL) injection  0.5 mg/kg Intravenous Q12H  . pantoprazole (PROTONIX) IV  40 mg  Intravenous Q24H  . zinc sulfate  220 mg Oral Daily   Continuous Infusions: . cefTRIAXone (ROCEPHIN)  IV 1 g (01/23/20 1251)     LOS: 5 days    Time spent: 30 minutes    Dorcas Carrow, MD [facility] Pager [phone]

## 2020-01-24 DIAGNOSIS — J069 Acute upper respiratory infection, unspecified: Secondary | ICD-10-CM | POA: Diagnosis not present

## 2020-01-24 DIAGNOSIS — U071 COVID-19: Secondary | ICD-10-CM | POA: Diagnosis not present

## 2020-01-24 LAB — CBC WITH DIFFERENTIAL/PLATELET
Abs Immature Granulocytes: 0.56 10*3/uL — ABNORMAL HIGH (ref 0.00–0.07)
Basophils Absolute: 0.1 10*3/uL (ref 0.0–0.1)
Basophils Relative: 0 %
Eosinophils Absolute: 0 10*3/uL (ref 0.0–0.5)
Eosinophils Relative: 0 %
HCT: 41.3 % (ref 36.0–46.0)
Hemoglobin: 13.3 g/dL (ref 12.0–15.0)
Immature Granulocytes: 5 %
Lymphocytes Relative: 9 %
Lymphs Abs: 1.1 10*3/uL (ref 0.7–4.0)
MCH: 25.4 pg — ABNORMAL LOW (ref 26.0–34.0)
MCHC: 32.2 g/dL (ref 30.0–36.0)
MCV: 79 fL — ABNORMAL LOW (ref 80.0–100.0)
Monocytes Absolute: 0.5 10*3/uL (ref 0.1–1.0)
Monocytes Relative: 4 %
Neutro Abs: 10 10*3/uL — ABNORMAL HIGH (ref 1.7–7.7)
Neutrophils Relative %: 82 %
Platelets: 268 10*3/uL (ref 150–400)
RBC: 5.23 MIL/uL — ABNORMAL HIGH (ref 3.87–5.11)
RDW: 18.2 % — ABNORMAL HIGH (ref 11.5–15.5)
WBC: 12.3 10*3/uL — ABNORMAL HIGH (ref 4.0–10.5)
nRBC: 0 % (ref 0.0–0.2)

## 2020-01-24 LAB — CULTURE, BLOOD (ROUTINE X 2)
Culture: NO GROWTH
Culture: NO GROWTH

## 2020-01-24 LAB — GLUCOSE, CAPILLARY
Glucose-Capillary: 220 mg/dL — ABNORMAL HIGH (ref 70–99)
Glucose-Capillary: 346 mg/dL — ABNORMAL HIGH (ref 70–99)
Glucose-Capillary: 319 mg/dL — ABNORMAL HIGH (ref 70–99)
Glucose-Capillary: 276 mg/dL — ABNORMAL HIGH (ref 70–99)

## 2020-01-24 LAB — COMPREHENSIVE METABOLIC PANEL
ALT: 27 U/L (ref 0–44)
AST: 19 U/L (ref 15–41)
Albumin: 2.5 g/dL — ABNORMAL LOW (ref 3.5–5.0)
Alkaline Phosphatase: 81 U/L (ref 38–126)
Anion gap: 10 (ref 5–15)
BUN: 38 mg/dL — ABNORMAL HIGH (ref 8–23)
CO2: 21 mmol/L — ABNORMAL LOW (ref 22–32)
Calcium: 8.9 mg/dL (ref 8.9–10.3)
Chloride: 110 mmol/L (ref 98–111)
Creatinine, Ser: 1.25 mg/dL — ABNORMAL HIGH (ref 0.44–1.00)
GFR calc Af Amer: 47 mL/min — ABNORMAL LOW (ref >60)
GFR calc non Af Amer: 41 mL/min — ABNORMAL LOW (ref >60)
Glucose, Bld: 267 mg/dL — ABNORMAL HIGH (ref 70–99)
Potassium: 4.4 mmol/L (ref 3.5–5.1)
Sodium: 141 mmol/L (ref 135–145)
Total Bilirubin: 0.7 mg/dL (ref 0.3–1.2)
Total Protein: 7 g/dL (ref 6.5–8.1)

## 2020-01-24 LAB — D-DIMER, QUANTITATIVE: D-Dimer, Quant: 2.18 ug/mL-FEU — ABNORMAL HIGH (ref 0.00–0.50)

## 2020-01-24 LAB — C-REACTIVE PROTEIN: CRP: 2.2 mg/dL — ABNORMAL HIGH (ref <1.0)

## 2020-01-24 MED ORDER — INSULIN GLARGINE 100 UNIT/ML ~~LOC~~ SOLN
15.0000 [IU] | Freq: Two times a day (BID) | SUBCUTANEOUS | Status: DC
  Administered 2020-01-24 – 2020-01-25 (×2): 15 [IU] via SUBCUTANEOUS
  Filled 2020-01-24 (×3): qty 0.15

## 2020-01-24 MED ORDER — PANTOPRAZOLE SODIUM 40 MG PO TBEC
40.0000 mg | DELAYED_RELEASE_TABLET | Freq: Every day | ORAL | Status: DC
  Administered 2020-01-25: 40 mg via ORAL
  Filled 2020-01-24: qty 1

## 2020-01-24 MED ORDER — INSULIN ASPART 100 UNIT/ML ~~LOC~~ SOLN
3.0000 [IU] | Freq: Three times a day (TID) | SUBCUTANEOUS | Status: DC
  Administered 2020-01-24 – 2020-01-25 (×3): 3 [IU] via SUBCUTANEOUS

## 2020-01-24 NOTE — Progress Notes (Signed)
Refusing tele monitior

## 2020-01-24 NOTE — Progress Notes (Signed)
Bilateral soft wrist restraints removed. Patient still confused and alert to only self and situation. On phone with her daughter at this time. Will continue to monitor.

## 2020-01-24 NOTE — Progress Notes (Signed)
PROGRESS NOTE    Christy Hanson  FAO:[id number] DOB: 02/26/1941 DOA: 01/18/2020 PCP: [facility]    Brief Narrative:  79 year old female with history of type 2 diabetes, hypertension, hyperlipidemia, chronic diastolic congestive heart failure, CKD stage III and hypothyroidism presented to the ER with progressive worsening shortness of breath and fatigue for 4 days after exposure to COVID-19 infection at home.  Admitted on 8/27.  In the emergency room, patient was afebrile, tachycardic and tachypneic.  Dropped blood pressure while in the ER.  Responded to fluid resuscitation.  Lactic acid was 3.4.  COVID-19 PCR positive.  Chest x-ray showed bilateral airspace opacities.   Assessment & Plan:   Principal Problem:   Acute respiratory disease due to COVID-19 virus Active Problems:   DM (diabetes mellitus), type 2 with renal complications (HCC)   Essential hypertension   CKD (chronic kidney disease), stage III  Acute hypoxemic respiratory failure due to COVID-19 pneumonia:  SpO2: 93 % O2 Flow Rate (L/min): 2 L/min   COVID-19 Labs  Recent Labs    01/22/20 0332 01/23/20 0322 01/24/20 0337  DDIMER 2.13* 2.14* 2.18*  CRP 5.6* 3.5* 2.2*    Lab Results  Component Value Date   SARSCOV2NAA POSITIVE (A) 01/18/2020   Continue to monitor due to significant symptoms  chest physiotherapy, incentive spirometry, deep breathing exercises, sputum induction, mucolytic's and bronchodilators. Supplemental oxygen to keep saturations more than 90%. Covid directed therapy with , steroids, on Solu-Medrol remdesivir, completed remdesivir therapy antibiotics, treated for suspected bacterial pneumonia with Rocephin and azithromycin.  She completed antibiotic therapy. Due to severity of symptoms, patient will need daily inflammatory markers, chest x-rays, liver function test to monitor and direct COVID-19 therapies.  Hypertension: Blood pressure stable on current  treatment.  Hypernatremia: Improved with fluid intake.  Acute metabolic encephalopathy: Unknown baseline. Due to acute viral infection and hypoxia, gradually improving. Will minimize intervention, dc tele. Soft restraints tomorrow.  Peptic ulcer disease: On Protonix. Will change to oral.   CKD stage IIIa: At about her baseline.    DVT prophylaxis: enoxaparin (LOVENOX) injection 40 mg Start: 01/20/20 1000   Code Status: full code  Family Communication: all 3 children on conference call Disposition Plan: Status is: Inpatient  Remains inpatient appropriate because:Inpatient level of care appropriate due to severity of illness   Dispo: The patient is from: Home              Anticipated d/c is to: Home with home health              Anticipated d/c date is: 2 days              Patient currently is not medically stable to d/c.         Consultants:   None.  Procedures:   None  Antimicrobials:  Antibiotics Given (last 72 hours)    Date/Time Action Medication Dose Rate   01/22/20 1107 Given   azithromycin (ZITHROMAX) tablet 500 mg 500 mg    01/22/20 1322 New Bag/Given   remdesivir 100 mg in sodium chloride 0.9 % 100 mL IVPB 100 mg 200 mL/hr   01/22/20 1758 New Bag/Given   cefTRIAXone (ROCEPHIN) 1 g in sodium chloride 0.9 % 100 mL IVPB 1 g 200 mL/hr   01/23/20 0928 Given   azithromycin (ZITHROMAX) tablet 500 mg 500 mg    01/23/20 1251 New Bag/Given   cefTRIAXone (ROCEPHIN) 1 g in sodium chloride 0.9 % 100 mL IVPB 1 g 200 mL/hr  01/24/20 1026 Given   azithromycin (ZITHROMAX) tablet 500 mg 500 mg          Subjective: Patient seen and examined.  Overnight she had some confusion and had soft restraints on.  Today morning, she was more composed and able to have better conversation.  He still has some confusion, however she is easily redirected.  Objective: Vitals:   01/23/20 1421 01/23/20 1946 01/24/20 0330 01/24/20 1359  BP: (!) 154/80 137/86 (!) 179/89 (!) 143/72   Pulse: 74 77 96 87  Resp: 19 18 18 20   Temp: 97.7 F (36.5 C) (!) 97.5 F (36.4 C) (!) 97.2 F (36.2 C) 97.8 F (36.6 C)  TempSrc: Oral Oral Oral Oral  SpO2: 92% 95% 97% 93%  Weight:      Height:        Intake/Output Summary (Last 24 hours) at 01/24/2020 1405 Last data filed at 01/24/2020 1100 Gross per 24 hour  Intake 710 ml  Output 475 ml  Net 235 ml   Filed Weights   01/18/20 2104 01/21/20 0540 01/23/20 0358  Weight: 97.5 kg 92.9 kg 92.8 kg    Examination:  General exam: Appears calm and comfortable .  On 2 L oxygen. Respiratory system: Clear to auscultation. Respiratory effort normal.  No added sounds. Cardiovascular system: S1 & S2 heard, RRR. No JVD, murmurs, rubs, gallops or clicks. No pedal edema. Gastrointestinal system: Abdomen is nondistended, soft and nontender. No organomegaly or masses felt. Normal bowel sounds heard. Extremities: Symmetric 5 x 5 power. Skin: No rashes, lesions or ulcers Psychiatry: Alert oriented x2.  Without any hallucinations or delusions.  No confusion today.    Data Reviewed: I have personally reviewed following labs and imaging studies  CBC: Recent Labs  Lab 01/20/20 1245 01/21/20 0434 01/22/20 0332 01/23/20 0322 01/24/20 0337  WBC 10.3 11.6* 10.6* 10.4 12.3*  NEUTROABS 8.6* 9.8* 8.7* 8.3* 10.0*  HGB 11.0* 11.6* 11.3* 12.1 13.3  HCT 34.2* 35.9* 36.2 38.0 41.3  MCV 79.0* 79.4* 80.3 80.0 79.0*  PLT 285 312 272 291 268   Basic Metabolic Panel: Recent Labs  Lab 01/20/20 1245 01/21/20 0434 01/22/20 0332 01/23/20 0322 01/24/20 0337  NA 142 142 146* 140 141  K 3.8 3.8 4.5 4.1 4.4  CL 110 110 112* 110 110  CO2 22 22 21* 19* 21*  GLUCOSE 98 75 164* 248* 267*  BUN 35* 37* 38* 39* 38*  CREATININE 1.17* 1.28* 1.45* 1.31* 1.25*  CALCIUM 8.6* 8.9 8.9 8.6* 8.9   GFR: Estimated Creatinine Clearance: 38.7 mL/min (A) (by C-G formula based on SCr of 1.25 mg/dL (H)). Liver Function Tests: Recent Labs  Lab 01/20/20 1245  01/21/20 0434 01/22/20 0332 01/23/20 0322 01/24/20 0337  AST 44* 42* 34 23 19  ALT 39 37 32 27 27  ALKPHOS 87 81 78 75 81  BILITOT 0.7 0.8 0.9 0.7 0.7  PROT 6.4* 6.5 6.4* 6.6 7.0  ALBUMIN 2.2* 2.3* 2.3* 2.3* 2.5*   No results for input(s): LIPASE, AMYLASE in the last 168 hours. No results for input(s): AMMONIA in the last 168 hours. Coagulation Profile: No results for input(s): INR, PROTIME in the last 168 hours. Cardiac Enzymes: No results for input(s): CKTOTAL, CKMB, CKMBINDEX, TROPONINI in the last 168 hours. BNP (last 3 results) No results for input(s): PROBNP in the last 8760 hours. HbA1C: No results for input(s): HGBA1C in the last 72 hours. CBG: Recent Labs  Lab 01/23/20 1153 01/23/20 1703 01/23/20 2115 01/24/20 0728 01/24/20 1138  GLUCAP 283* 211* 258* 220* 346*   Lipid Profile: No results for input(s): CHOL, HDL, LDLCALC, TRIG, CHOLHDL, LDLDIRECT in the last 72 hours. Thyroid Function Tests: No results for input(s): TSH, T4TOTAL, FREET4, T3FREE, THYROIDAB in the last 72 hours. Anemia Panel: No results for input(s): VITAMINB12, FOLATE, FERRITIN, TIBC, IRON, RETICCTPCT in the last 72 hours. Sepsis Labs: Recent Labs  Lab 01/18/20 2127 01/18/20 2228 01/19/20 0129 01/19/20 0618 01/22/20 0332 01/23/20 0322  PROCALCITON  --  0.99  --   --  0.22 <0.10  LATICACIDVEN 3.4*  --  1.7 1.2  --   --     Recent Results (from the past 240 hour(s))  SARS Coronavirus 2 by RT PCR (hospital order, performed in [facility] hospital lab) Nasopharyngeal Nasopharyngeal Swab     Status: Abnormal   Collection Time: 01/18/20  8:59 PM   Specimen: Nasopharyngeal Swab  Result Value Ref Range Status   SARS Coronavirus 2 POSITIVE (A) NEGATIVE Final    Comment: RESULT CALLED TO, READ BACK BY AND VERIFIED WITH: J,NASH AT 2256 ON 01/18/20 BY A,MOHAMED (NOTE) SARS-CoV-2 target nucleic acids are DETECTED  SARS-CoV-2 RNA is generally detectable in upper respiratory specimens  during  the acute phase of infection.  Positive results are indicative  of the presence of the identified virus, but do not rule out bacterial infection or co-infection with other pathogens not detected by the test.  Clinical correlation with patient history and  other diagnostic information is necessary to determine patient infection status.  The expected result is negative.  Fact Sheet for Patients:   BoilerBrush.com.cy   Fact Sheet for Healthcare Providers:   https://pope.com/    This test is not yet approved or cleared by the [place] FDA and  has been authorized for detection and/or diagnosis of SARS-CoV-2 by FDA under an Emergency Use Authorization (EUA).  This EUA will remain in effect (meaning thi s test can be used) for the duration of  the COVID-19 declaration under Section 564(b)(1) of the Act, 21 U.S.C. section 360-bbb-3(b)(1), unless the authorization is terminated or revoked sooner.  Performed at [facility], [address] [place], [place] [address]   Blood Culture (routine x 2)     Status: None   Collection Time: 01/18/20  9:27 PM   Specimen: BLOOD  Result Value Ref Range Status   Specimen Description   Final    BLOOD RIGHT ANTECUBITAL Performed at [facility], [address], [place], [place] [address]    Special Requests   Final    BOTTLES DRAWN AEROBIC AND ANAEROBIC Blood Culture results may not be optimal due to an inadequate volume of blood received in culture bottles Performed at [facility], [address] [place], [place] [address]    Culture   Final    NO GROWTH 5 DAYS Performed at [facility], [address], [place], [place] [address]    Report Status 01/24/2020 FINAL  Final  Blood Culture (routine x 2)     Status: None   Collection Time: 01/18/20  9:27 PM   Specimen: BLOOD  Result Value Ref Range Status   Specimen Description   Final     BLOOD LEFT ANTECUBITAL Performed at [facility], [address], [place], [place] [address]    Special Requests   Final    BOTTLES DRAWN AEROBIC ONLY Blood Culture results may not be optimal due to an inadequate volume of blood received  in culture bottles Performed at [facility], [address], [place], [place] [address]    Culture   Final    NO GROWTH 5 DAYS Performed at [facility], [address], [place], [place] [address]    Report Status 01/24/2020 FINAL  Final         Radiology Studies: DG CHEST PORT 1 VIEW  Result Date: 01/22/2020 CLINICAL DATA:  Shortness of breath EXAM: PORTABLE CHEST 1 VIEW COMPARISON:  01/18/2020 FINDINGS: Similar appearance of perihilar and basilar airspace disease. No pleural effusion. Stable cardiomediastinal silhouette. No pneumothorax. IMPRESSION: No significant interval change in perihilar and basilar airspace disease presumably representing bilateral pneumonia. Electronically Signed   By: Jasmine Pang M.D.   On: 01/22/2020 19:00        Scheduled Meds: . amLODipine  5 mg Oral Daily  . vitamin C  500 mg Oral Daily  . brimonidine  1 drop Both Eyes BID  . dorzolamide-timolol  1 drop Both Eyes BID  . enoxaparin (LOVENOX) injection  40 mg Subcutaneous Q24H  . ferrous sulfate  325 mg Oral Q breakfast  . insulin aspart  0-9 Units Subcutaneous TID WC  . insulin glargine  10 Units Subcutaneous Daily  . latanoprost  1 drop Both Eyes QHS  . losartan  25 mg Oral Daily  . methylPREDNISolone (SOLU-MEDROL) injection  0.5 mg/kg Intravenous Q12H  . pantoprazole  40 mg Oral Daily  . zinc sulfate  220 mg Oral Daily   Continuous Infusions:    LOS: 6 days    Time spent: 30 minutes    Dorcas Carrow, MD [facility] Pager [phone]

## 2020-01-24 NOTE — Care Management Important Message (Signed)
Important Message  Patient Details IM Letter given to the Patient Name: Christy Hanson MRN: [id number] Date of Birth: 1940/07/28   Medicare Important Message Given:  Yes     Caren Macadam 01/24/2020, 10:15 AM

## 2020-01-24 NOTE — TOC Progression Note (Signed)
Transition of Care [facility]) - Progression Note    Patient Details  Name: Christy Hanson MRN: [id number] Date of Birth: 1940-05-28  Transition of Care [facility]) CM/SW Contact  Ida Rogue, [place] Phone Number: 01/24/2020, 2:49 PM  Clinical Narrative: Spoke with daughter [name] today. She is heartened by the fact that her mother is thinking a bit more clearly today.  She confirmed that the plan is still to bring her mother home, and [name] has the support of her sister who will be helping out.  They plan to be firm with her when she does not want to do her exercises or get up and move.  [name] does not want me to look for SNF as a back up plan, but we did go over the process of her to get her mother into SNF through her PCP if need be.  Patient is set up for [facility] PT, OT, RN and aide through [place].  [facility] accepted. TOC will continue to follow during the course of hospitalization.       Expected Discharge Plan: Home w Home Health Services Barriers to Discharge: No Barriers Identified  Expected Discharge Plan and Services Expected Discharge Plan: Home w Home Health Services   Discharge Planning Services: CM Consult Post Acute Care Choice: Home Health Living arrangements for the past 2 months: Apartment                                       Social Determinants of Health (SDOH) Interventions    Readmission Risk Interventions No flowsheet data found.

## 2020-01-25 DIAGNOSIS — J069 Acute upper respiratory infection, unspecified: Secondary | ICD-10-CM | POA: Diagnosis not present

## 2020-01-25 DIAGNOSIS — U071 COVID-19: Secondary | ICD-10-CM | POA: Diagnosis not present

## 2020-01-25 LAB — GLUCOSE, CAPILLARY
Glucose-Capillary: 164 mg/dL — ABNORMAL HIGH (ref 70–99)
Glucose-Capillary: 292 mg/dL — ABNORMAL HIGH (ref 70–99)

## 2020-01-25 MED ORDER — ZINC SULFATE 220 (50 ZN) MG PO CAPS: 220.0000 mg | ORAL_CAPSULE | Freq: Every day | ORAL | 0 refills | Status: AC

## 2020-01-25 MED ORDER — PREDNISONE 10 MG PO TABS: ORAL_TABLET | ORAL | 0 refills | Status: DC

## 2020-01-25 NOTE — Discharge Summary (Signed)
Physician Discharge Summary  Christy LyonsDorothey Hanson ZOX:[id number] DOB: 01/30/1941 DOA: 01/18/2020  [facility]: [facility]  Admit date: 01/18/2020 Discharge date: 01/25/2020  Admitted From: Home Disposition: Home  Recommendations for Outpatient Follow-up:  1. Follow up with PCP in 1-2 weeks 2. 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  She was admitted to hospital on 8/27 with high flow oxygen.  Had low blood pressure on arrival and lactic acid was 3.4.  COVID-19 was positive.  Chest x-ray showed bilateral airspace opacities.  Patient was admitted to the hospital for treatment of acute hypoxemic respiratory failure due to COVID-19 pneumonia, she stayed in the hospital for about 1 week and treated with aggressive bronchodilator therapies and respiratory treatments, supportive therapies as well as remdesivir for 5 days, high-dose steroids.  For suspected bacterial pneumonia she was also treated with Rocephin and azithromycin for 5 days.  She has done good clinical recovery.  Her symptoms has improved.  Patient is a still needing 2 L supplemental oxygen on ambulation.  Patient also had confusion and was encephalopathic on arrival that has all cleared now and improved.  Plan: With adequate clinical improvement she is going home today.  Will gradually taper off steroids 3 more days. Supportive treatments at home including over-the-counter treatment options discussed with patient and family. Isolation precautions, 2 more weeks due to moderate to  severe disease. Patient is deconditioned and will benefit with home health PT OT and home health aide. Extensive education and counseling done regarding use of COVID-19 vaccine and its benefits and she is going to take vaccine after improvement of symptoms. Continue all her long-term medications including insulin and monitor for blood sugars at home. Blood pressures are picking up and elevated today, she will resume all her home medications including as needed Lasix.  Discharge Diagnoses:  Principal Problem:   Acute respiratory disease due to COVID-19 virus Active Problems:   DM (diabetes mellitus), type 2 with renal complications (HCC)   Essential hypertension   CKD (chronic kidney disease), stage III    Discharge Instructions  Discharge Instructions    Call MD for:  difficulty breathing, headache or visual disturbances   Complete by: As directed    Call MD for:  extreme fatigue   Complete by: As directed    Call MD for:  persistant dizziness or light-headedness   Complete by: As directed    Call MD for:  temperature >100.4   Complete by: As directed    Diet - low sodium heart healthy   Complete by: As directed    Diet Carb Modified   Complete by: As directed    Discharge instructions   Complete by: As directed    Total 21 days isolation since 8/27 Can use over the counter cough medications and tylenol Recommend vaccination once your symptoms improve.   Increase activity slowly   Complete by: As directed    No wound care   Complete by: As directed      Allergies as of 01/25/2020   No Known Allergies     Medication List    STOP taking these medications  benzonatate 100 MG capsule Commonly known as: TESSALON   QUEtiapine 50 MG tablet Commonly known as: SEROquel     TAKE these medications   brimonidine 0.15 % ophthalmic solution Commonly known as: ALPHAGAN Place 1 drop into both eyes 2 (two) times daily.   diclofenac Sodium 1 % Gel Commonly known as:  VOLTAREN Apply 1 application topically 2 (two) times daily as needed for pain.   dorzolamide-timolol 22.3-6.8 MG/ML ophthalmic solution Commonly known as: COSOPT Place 1 drop into both eyes 2 (two) times daily.   ferrous sulfate 325 (65 FE) MG EC tablet Take 325 mg by mouth daily with breakfast.   furosemide 40 MG tablet Commonly known as: LASIX Take 40 mg by mouth daily as needed for fluid or edema.   latanoprost 0.005 % ophthalmic solution Commonly known as: XALATAN Place 1 drop into both eyes at bedtime.   Levemir FlexTouch 100 UNIT/ML FlexPen Generic drug: insulin detemir Inject 40 Units into the skin 2 (two) times daily.   losartan 25 MG tablet Commonly known as: COZAAR Take 25 mg by mouth daily.   metFORMIN 1000 MG tablet Commonly known as: GLUCOPHAGE Take 1,000 mg by mouth 2 (two) times daily.   metoprolol tartrate 100 MG tablet Commonly known as: LOPRESSOR Take 100 mg by mouth 2 (two) times daily.   multivitamin with minerals Tabs tablet Take 1 tablet by mouth daily.   predniSONE 10 MG tablet Commonly known as: DELTASONE 2 tabs daily for 2 days 1 tab daily for 2 days   triamcinolone ointment 0.1 % Commonly known as: KENALOG Apply 1 application topically 3 (three) times daily as needed (Skin rash).   zinc sulfate 220 (50 Zn) MG capsule Take 1 capsule (220 mg total) by mouth daily for 14 days. Start taking on: January 26, 2020            Durable Medical Equipment  (From admission, onward)         Start     Ordered   01/25/20 1132  For home use only DME oxygen  Once       Comments: Patient Saturations on Room Air at Rest = 94%  Patient Saturations on [employer] while Ambulating = 86%  Patient Saturations on 2 Liters of oxygen while Ambulating = 92%  Question Answer Comment  Length of Need 6 Months   Mode or (Route) Nasal cannula   Liters per Minute 2   Frequency Continuous (stationary and portable oxygen unit needed)   Oxygen conserving  device Yes   Oxygen delivery system Gas      01/25/20 1131          Follow-up Information    [facility] Follow up in 2 week(s).   Contact information: [address] [place] [address] [phone]              No Known Allergies  Consultations:  None   Procedures/Studies: DG CHEST PORT 1 VIEW  Result Date: 01/22/2020 CLINICAL DATA:  Shortness of breath EXAM: PORTABLE CHEST 1 VIEW COMPARISON:  01/18/2020 FINDINGS: Similar appearance of perihilar and basilar airspace disease. No pleural effusion. Stable cardiomediastinal silhouette. No pneumothorax. IMPRESSION: No significant interval change in perihilar and basilar airspace disease presumably representing bilateral pneumonia. Electronically Signed   By: Jasmine Pang M.D.   On: 01/22/2020 19:00   DG Chest Port 1 View  Result Date: 01/18/2020 CLINICAL DATA:  Cough and shortness of breath, history of recent COVID exposure EXAM: PORTABLE CHEST 1  VIEW COMPARISON:  10/22/2012 FINDINGS: Cardiac shadow is at the upper limits of normal in size. Mild bibasilar airspace opacities are noted. Correlate with COVID-19 testing. No sizable effusion is seen. No bony abnormality is noted. IMPRESSION: Bibasilar airspace opacities.  Correlate with COVID testing Electronically Signed   By: Alcide Clever M.D.   On: 01/18/2020 22:01    (Echo, Carotid, EGD, Colonoscopy, ERCP)    Subjective: Patient seen and examined.  No overnight events.  No more confusion.  She is excited to go home with her baby daughter.  Mostly on room air at rest, desaturation on ambulation. Called and discussed with patient's daughter in details.   Discharge Exam: Vitals:   01/24/20 2040 01/25/20 0443  BP: (!) 154/77 (!) 154/84  Pulse: 85 84  Resp: 18 20  Temp: 97.6 F (36.4 C) 97.9 F (36.6 C)  SpO2: 95% 98%   Vitals:   01/24/20 1359 01/24/20 2040 01/25/20 0235 01/25/20 0443  BP: (!) 143/72 (!) 154/77  (!) 154/84  Pulse: 87 85  84  Resp:  20 18  20   Temp: 97.8 F (36.6 C) 97.6 F (36.4 C)  97.9 F (36.6 C)  TempSrc: Oral Oral    SpO2: 93% 95%  98%  Weight:   93 kg   Height:        General: Pt is alert, awake, not in acute distress Chronically sick looking.  Alert awake oriented x3.  Not in any distress. Cardiovascular: RRR, S1/S2 +, no rubs, no gallops Respiratory: CTA bilaterally, no wheezing, no rhonchi Abdominal: Soft, NT, ND, bowel sounds +, obese and pendulous. Extremities: no edema, no cyanosis    The results of significant diagnostics from this hospitalization (including imaging, microbiology, ancillary and laboratory) are listed below for reference.     Microbiology: Recent Results (from the past 240 hour(s))  SARS Coronavirus 2 by RT PCR (hospital order, performed in [facility] hospital lab) Nasopharyngeal Nasopharyngeal Swab     Status: Abnormal   Collection Time: 01/18/20  8:59 PM   Specimen: Nasopharyngeal Swab  Result Value Ref Range Status   SARS Coronavirus 2 POSITIVE (A) NEGATIVE Final    Comment: RESULT CALLED TO, READ BACK BY AND VERIFIED WITH: J,NASH AT 2256 ON 01/18/20 BY A,MOHAMED (NOTE) SARS-CoV-2 target nucleic acids are DETECTED  SARS-CoV-2 RNA is generally detectable in upper respiratory specimens  during the acute phase of infection.  Positive results are indicative  of the presence of the identified virus, but do not rule out bacterial infection or co-infection with other pathogens not detected by the test.  Clinical correlation with patient history and  other diagnostic information is necessary to determine patient infection status.  The expected result is negative.  Fact Sheet for Patients:   01/20/20   Fact Sheet for Healthcare Providers:   BoilerBrush.com.cy    This test is not yet approved or cleared by the https://pope.com/ FDA and  has been authorized for detection and/or diagnosis of SARS-CoV-2 by FDA under an  Emergency Use Authorization (EUA).  This EUA will remain in effect (meaning thi s test can be used) for the duration of  the COVID-19 declaration under Section 564(b)(1) of the Act, 21 U.S.C. section 360-bbb-3(b)(1), unless the authorization is terminated or revoked sooner.  Performed at [facility], [address] [place], [place]   Blood Culture (routine x 2)     Status: None   Collection Time: 01/18/20  9:27 PM   Specimen: BLOOD  Result  Value Ref Range Status   Specimen Description   Final    BLOOD RIGHT ANTECUBITAL Performed at [facility], [address], [place], [place] [address]    Special Requests   Final    BOTTLES DRAWN AEROBIC AND ANAEROBIC Blood Culture results may not be optimal due to an inadequate volume of blood received in culture bottles Performed at [facility], [address], [place], [place] [address]    Culture   Final    NO GROWTH 5 DAYS Performed at [facility], [address], [place], [place] [address]    Report Status 01/24/2020 FINAL  Final  Blood Culture (routine x 2)     Status: None   Collection Time: 01/18/20  9:27 PM   Specimen: BLOOD  Result Value Ref Range Status   Specimen Description   Final    BLOOD LEFT ANTECUBITAL Performed at [facility], [address] [place], [place] [address]    Special Requests   Final    BOTTLES DRAWN AEROBIC ONLY Blood Culture results may not be optimal due to an inadequate volume of blood received in culture bottles Performed at [facility], [address], [place], [place] [address]    Culture   Final    NO GROWTH 5 DAYS Performed at [facility], [address], [place], [place] [address]    Report Status 01/24/2020 FINAL  Final     Labs: BNP (last 3 results) No results for input(s): BNP in the last 8760 hours. Basic Metabolic Panel: Recent Labs  Lab 01/20/20 1245  01/21/20 0434 01/22/20 0332 01/23/20 0322 01/24/20 0337  NA 142 142 146* 140 141  K 3.8 3.8 4.5 4.1 4.4  CL 110 110 112* 110 110  CO2 22 22 21* 19* 21*  GLUCOSE 98 75 164* 248* 267*  BUN 35* 37* 38* 39* 38*  CREATININE 1.17* 1.28* 1.45* 1.31* 1.25*  CALCIUM 8.6* 8.9 8.9 8.6* 8.9   Liver Function Tests: Recent Labs  Lab 01/20/20 1245 01/21/20 0434 01/22/20 0332 01/23/20 0322 01/24/20 0337  AST 44* 42* 34 23 19  ALT 39 37 32 27 27  ALKPHOS 87 81 78 75 81  BILITOT 0.7 0.8 0.9 0.7 0.7  PROT 6.4* 6.5 6.4* 6.6 7.0  ALBUMIN 2.2* 2.3* 2.3* 2.3* 2.5*   No results for input(s): LIPASE, AMYLASE in the last 168 hours. No results for input(s): AMMONIA in the last 168 hours. CBC: Recent Labs  Lab 01/20/20 1245 01/21/20 0434 01/22/20 0332 01/23/20 0322 01/24/20 0337  WBC 10.3 11.6* 10.6* 10.4 12.3*  NEUTROABS 8.6* 9.8* 8.7* 8.3* 10.0*  HGB 11.0* 11.6* 11.3* 12.1 13.3  HCT 34.2* 35.9* 36.2 38.0 41.3  MCV 79.0* 79.4* 80.3 80.0 79.0*  PLT 285 312 272 291 268   Cardiac Enzymes: No results for input(s): CKTOTAL, CKMB, CKMBINDEX, TROPONINI in the last 168 hours. BNP: Invalid input(s): POCBNP CBG: Recent Labs  Lab 01/24/20 0728 01/24/20 1138 01/24/20 1713 01/24/20 2156 01/25/20 0807  GLUCAP 220* 346* 319* 276* 164*   D-Dimer Recent Labs    01/23/20 0322 01/24/20 0337  DDIMER 2.14* 2.18*   Hgb A1c No results for input(s): HGBA1C in the last 72 hours. Lipid Profile No results for input(s): CHOL, HDL, LDLCALC, TRIG, CHOLHDL, LDLDIRECT in the last 72 hours. Thyroid function studies No results for input(s): TSH, T4TOTAL, T3FREE, THYROIDAB in the last 72 hours.  Invalid input(s): FREET3 Anemia work up No results  for input(s): VITAMINB12, FOLATE, FERRITIN, TIBC, IRON, RETICCTPCT in the last 72 hours. Urinalysis    Component Value Date/Time   COLORURINE YELLOW 01/20/2020 0920   APPEARANCEUR CLEAR 01/20/2020 0920   LABSPEC 1.017 01/20/2020 0920   PHURINE 5.0  01/20/2020 0920   GLUCOSEU NEGATIVE 01/20/2020 0920   HGBUR NEGATIVE 01/20/2020 0920   BILIRUBINUR NEGATIVE 01/20/2020 0920   KETONESUR NEGATIVE 01/20/2020 0920   PROTEINUR 30 (A) 01/20/2020 0920   NITRITE NEGATIVE 01/20/2020 0920   LEUKOCYTESUR TRACE (A) 01/20/2020 0920   Sepsis Labs Invalid input(s): PROCALCITONIN,  WBC,  LACTICIDVEN Microbiology Recent Results (from the past 240 hour(s))  SARS Coronavirus 2 by RT PCR (hospital order, performed in [facility] hospital lab) Nasopharyngeal Nasopharyngeal Swab     Status: Abnormal   Collection Time: 01/18/20  8:59 PM   Specimen: Nasopharyngeal Swab  Result Value Ref Range Status   SARS Coronavirus 2 POSITIVE (A) NEGATIVE Final    Comment: RESULT CALLED TO, READ BACK BY AND VERIFIED WITH: J,NASH AT 2256 ON 01/18/20 BY A,MOHAMED (NOTE) SARS-CoV-2 target nucleic acids are DETECTED  SARS-CoV-2 RNA is generally detectable in upper respiratory specimens  during the acute phase of infection.  Positive results are indicative  of the presence of the identified virus, but do not rule out bacterial infection or co-infection with other pathogens not detected by the test.  Clinical correlation with patient history and  other diagnostic information is necessary to determine patient infection status.  The expected result is negative.  Fact Sheet for Patients:   BoilerBrush.com.cy   Fact Sheet for Healthcare Providers:   https://pope.com/    This test is not yet approved or cleared by the [place] FDA and  has been authorized for detection and/or diagnosis of SARS-CoV-2 by FDA under an Emergency Use Authorization (EUA).  This EUA will remain in effect (meaning thi s test can be used) for the duration of  the COVID-19 declaration under Section 564(b)(1) of the Act, 21 U.S.C. section 360-bbb-3(b)(1), unless the authorization is terminated or revoked sooner.  Performed at [facility], [address] [place], [place] [address]   Blood Culture (routine x 2)     Status: None   Collection Time: 01/18/20  9:27 PM   Specimen: BLOOD  Result Value Ref Range Status   Specimen Description   Final    BLOOD RIGHT ANTECUBITAL Performed at [facility], [address] [place], [place] [address]    Special Requests   Final    BOTTLES DRAWN AEROBIC AND ANAEROBIC Blood Culture results may not be optimal due to an inadequate volume of blood received in culture bottles Performed at [facility], [address] [place], [place] [address]    Culture   Final    NO GROWTH 5 DAYS Performed at [facility], [address], [place], [place] [address]    Report Status 01/24/2020 FINAL  Final  Blood Culture (routine x 2)     Status: None   Collection Time: 01/18/20  9:27 PM   Specimen: BLOOD  Result Value Ref Range Status   Specimen Description   Final    BLOOD LEFT ANTECUBITAL Performed at [facility], [address] [place], [place] [address]    Special Requests   Final    BOTTLES DRAWN AEROBIC ONLY Blood Culture results may not be optimal due to an inadequate volume of blood received in culture bottles Performed at [facility], [address], [place], [place] [address]    Culture   Final    NO GROWTH 5 DAYS Performed at [facility], [address] [place], [place] [address]    Report Status 01/24/2020 FINAL  Final     Time coordinating discharge:  40 minutes  SIGNED:   Dorcas Carrow, MD  [facility] 01/25/2020, 11:48 AM

## 2020-01-25 NOTE — TOC Transition Note (Addendum)
Transition of Care [facility]) - CM/SW Discharge Note   Patient Details  Name: Christy Hanson MRN: [id number] Date of Birth: Apr 20, 1941  [facility]) CM/SW Contact:  Ida Rogue, LCSW Phone Number: 01/25/2020, 11:48 AM   Clinical Narrative:   Patient is slated for d/c today.  Already set up for [facility] services is in need of home O2.  Jerolyn Center with [employer] who will deliver portable unit to patient's room for d/c, and home unit for permanent use. Daughter to transport home today.  No further needs identified. TOC sign off.  Addendum: Reminded by daughter that we had talked about DME walker and 3 in 1.  Asked MD for orders and called [employer] for delivery.    Final next level of care: Home w Home Health Services Barriers to Discharge: No Barriers Identified   Patient Goals and CMS Choice     Choice offered to / list presented to : Adult Children  Discharge Placement                       Discharge Plan and Services   Discharge Planning Services: CM Consult Post Acute Care Choice: Home Health                               Social Determinants of Health (SDOH) Interventions     Readmission Risk Interventions No flowsheet data found.

## 2020-01-25 NOTE — Progress Notes (Signed)
SATURATION QUALIFICATIONS: (This note is used to comply with regulatory documentation for home oxygen)  Patient Saturations on Room Air at Rest = 94%  Patient Saturations on Room Air while Ambulating = 86%  Patient Saturations on 2 Liters of oxygen while Ambulating = 92%  Please briefly explain why patient needs home oxygen: Home O2 at 2liters because unable to adequately maintain adequate O2sats 90% or above on room air while ambulating.

## 2020-06-30 ENCOUNTER — Encounter (HOSPITAL_COMMUNITY): Payer: Self-pay

## 2020-06-30 ENCOUNTER — Emergency Department (HOSPITAL_COMMUNITY)
Admission: EM | Admit: 2020-06-30 | Discharge: 2020-06-30 | Disposition: A | Discharge status: Home / Self Care | Payer: Medicare Other | Attending: Emergency Medicine | Admitting: Emergency Medicine

## 2020-06-30 ENCOUNTER — Other Ambulatory Visit: Payer: Self-pay

## 2020-06-30 DIAGNOSIS — R443 Hallucinations, unspecified: Secondary | ICD-10-CM | POA: Insufficient documentation

## 2020-06-30 DIAGNOSIS — E1122 Type 2 diabetes mellitus with diabetic chronic kidney disease: Secondary | ICD-10-CM | POA: Diagnosis not present

## 2020-06-30 DIAGNOSIS — Z8616 Personal history of COVID-19: Secondary | ICD-10-CM | POA: Diagnosis not present

## 2020-06-30 DIAGNOSIS — I129 Hypertensive chronic kidney disease with stage 1 through stage 4 chronic kidney disease, or unspecified chronic kidney disease: Secondary | ICD-10-CM | POA: Diagnosis not present

## 2020-06-30 DIAGNOSIS — N183 Chronic kidney disease, stage 3 unspecified: Secondary | ICD-10-CM | POA: Insufficient documentation

## 2020-06-30 DIAGNOSIS — Z79899 Other long term (current) drug therapy: Secondary | ICD-10-CM | POA: Diagnosis not present

## 2020-06-30 DIAGNOSIS — Z7984 Long term (current) use of oral hypoglycemic drugs: Secondary | ICD-10-CM | POA: Insufficient documentation

## 2020-06-30 HISTORY — DX: Hallucinations, unspecified: R44.3

## 2020-06-30 LAB — COMPREHENSIVE METABOLIC PANEL
ALT: 12 U/L (ref 0–44)
AST: 19 U/L (ref 15–41)
Albumin: 3.9 g/dL (ref 3.5–5.0)
Alkaline Phosphatase: 97 U/L (ref 38–126)
Anion gap: 13 (ref 5–15)
BUN: 16 mg/dL (ref 8–23)
CO2: 22 mmol/L (ref 22–32)
Calcium: 9.4 mg/dL (ref 8.9–10.3)
Chloride: 106 mmol/L (ref 98–111)
Creatinine, Ser: 1.2 mg/dL — ABNORMAL HIGH (ref 0.44–1.00)
GFR, Estimated: 46 mL/min — ABNORMAL LOW (ref >60)
Glucose, Bld: 117 mg/dL — ABNORMAL HIGH (ref 70–99)
Potassium: 4 mmol/L (ref 3.5–5.1)
Sodium: 141 mmol/L (ref 135–145)
Total Bilirubin: 0.7 mg/dL (ref 0.3–1.2)
Total Protein: 7.5 g/dL (ref 6.5–8.1)

## 2020-06-30 LAB — CBC WITH DIFFERENTIAL/PLATELET
Abs Immature Granulocytes: 0.01 10*3/uL (ref 0.00–0.07)
Basophils Absolute: 0 10*3/uL (ref 0.0–0.1)
Basophils Relative: 1 %
Eosinophils Absolute: 0.2 10*3/uL (ref 0.0–0.5)
Eosinophils Relative: 3 %
HCT: 41.2 % (ref 36.0–46.0)
Hemoglobin: 13.3 g/dL (ref 12.0–15.0)
Immature Granulocytes: 0 %
Lymphocytes Relative: 47 %
Lymphs Abs: 2.9 10*3/uL (ref 0.7–4.0)
MCH: 26.7 pg (ref 26.0–34.0)
MCHC: 32.3 g/dL (ref 30.0–36.0)
MCV: 82.7 fL (ref 80.0–100.0)
Monocytes Absolute: 0.7 10*3/uL (ref 0.1–1.0)
Monocytes Relative: 11 %
Neutro Abs: 2.4 10*3/uL (ref 1.7–7.7)
Neutrophils Relative %: 38 %
Platelets: 224 10*3/uL (ref 150–400)
RBC: 4.98 MIL/uL (ref 3.87–5.11)
RDW: 15.4 % (ref 11.5–15.5)
WBC: 6.1 10*3/uL (ref 4.0–10.5)
nRBC: 0 % (ref 0.0–0.2)

## 2020-06-30 LAB — URINALYSIS, ROUTINE W REFLEX MICROSCOPIC
Bilirubin Urine: NEGATIVE
Glucose, UA: NEGATIVE mg/dL
Hgb urine dipstick: NEGATIVE
Ketones, ur: NEGATIVE mg/dL
Leukocytes,Ua: NEGATIVE
Nitrite: NEGATIVE
Protein, ur: NEGATIVE mg/dL
Specific Gravity, Urine: 1.01 (ref 1.005–1.030)
pH: 5 (ref 5.0–8.0)

## 2020-06-30 LAB — ETHANOL: Alcohol, Ethyl (B): <10 mg/dL (ref <10)

## 2020-06-30 NOTE — ED Provider Notes (Signed)
Urinalysis unremarkable.  Overall patient with unremarkable work-up.  Likely exacerbation of chronic issue today.  Currently being worked up for dementia or dementia related illness with neurology.  Has appointment next week.  Recommend discussing with primary care doctor about further medications for sleep aid.  Given return precautions and discharged in good condition.  This chart was dictated using voice recognition software.  Despite best efforts to proofread,  errors can occur which can change the documentation meaning.     Virgina Norfolk, DO 06/30/20 1610

## 2020-06-30 NOTE — ED Notes (Signed)
Vitals was late because patient refused at first stated "she was not going to continue to wait out here this long.  Patient changed her mind after I got another patient vitals

## 2020-06-30 NOTE — ED Triage Notes (Signed)
Patient's daughter reports that the patient has had hallucinations and dizziness for several years, but worse in the past 2 days. Daughter reports that the patient saw someone fixing the ceiling fan and was talking to them. Daughter reports that the patient is on a low dose of Seroquel. Daughter reports that the patient does not take regularly because it causes her to be drowsy.

## 2020-06-30 NOTE — ED Notes (Signed)
Dr. Jeraldine Loots aware pts BP 195/87.

## 2020-06-30 NOTE — Discharge Instructions (Addendum)
As discussed, your evaluation today has been largely reassuring.  But, it is important that you monitor your condition carefully, and do not hesitate to return to the ED if you develop new, or concerning changes in your condition.  Otherwise, please follow-up with your physician for appropriate ongoing care.  In the interim, please take all your medication as directed, including the Seroquel.

## 2020-06-30 NOTE — ED Notes (Signed)
Pt ambulated to restroom and back to room unassisted.

## 2020-06-30 NOTE — ED Provider Notes (Signed)
[facility]-EMERGENCY DEPT Provider Note   CSN: [id number] Arrival date & time: 06/30/20  0932     History Chief Complaint  Patient presents with  . Dizziness  . Hallucinations    Christy Hanson is a 80 y.o. female.  HPI Patient presents with her daughter who provides much of the history.  The patient is awake and alert, answering questions, including details about her paranoia and hallucinations seemingly appropriately. Patient has had paranoia and hallucinations for a long time, is currently being evaluated by neurology has scheduled follow-up next week.  She had MRI performed last week.  They present today to the emergency department due to more vivid hallucinations and paranoia than usual, over the past 4 or 5 days. The patient denies pain, or other physical complaints.  No report of recent illness including fever, vomiting, cough. When asked about hallucinations the patient replies that she is convinced that someone is present, while there is clearly not an individual in the vicinity.  With worsening paranoia, hallucinations. No recent medication change, diet change, activity change. The patient recently moved to this area from [place].  Patient has a prescription for Seroquel, it is unclear if she is taking this daily, as she and her daughter offer conflicting responses.    Past Medical History:  Diagnosis Date  . Diabetes mellitus without complication (HCC)   . Hallucinations   . High cholesterol   . Hypertension   . Thyroid disease     Patient Active Problem List   Diagnosis Date Noted  . DM (diabetes mellitus), type 2 with renal complications (HCC) 01/19/2020  . Essential hypertension 01/19/2020  . CKD (chronic kidney disease), stage III (HCC) 01/19/2020  . Acute respiratory disease due to COVID-19 virus 01/18/2020    Past Surgical History:  Procedure Laterality Date  . gallstone    . HERNIA REPAIR       OB History   No  obstetric history on file.     Family History  Problem Relation Age of Onset  . Diabetes Mellitus II Brother     Social History   Tobacco Use  . Smoking status: Never Smoker  . Smokeless tobacco: Never Used  Vaping Use  . Vaping Use: Never used  Substance Use Topics  . Alcohol use: No  . Drug use: No    Home Medications Prior to Admission medications   Medication Sig Start Date End Date Taking? Authorizing Provider  brimonidine (ALPHAGAN) 0.15 % ophthalmic solution Place 1 drop into both eyes 2 (two) times daily. 11/05/19   [provider]  diclofenac Sodium (VOLTAREN) 1 % GEL Apply 1 application topically 2 (two) times daily as needed for pain. 11/05/19   [provider]  dorzolamide-timolol (COSOPT) 22.3-6.8 MG/ML ophthalmic solution Place 1 drop into both eyes 2 (two) times daily. 04/25/15   [provider]  ferrous sulfate 325 (65 FE) MG EC tablet Take 325 mg by mouth daily with breakfast.    [provider]  furosemide (LASIX) 40 MG tablet Take 40 mg by mouth daily as needed for fluid or edema.  03/14/15   [provider]  latanoprost (XALATAN) 0.005 % ophthalmic solution Place 1 drop into both eyes at bedtime. 11/05/19   [provider]  LEVEMIR FLEXTOUCH 100 UNIT/ML Pen Inject 40 Units into the skin 2 (two) times daily. 03/18/15   [provider]  losartan (COZAAR) 25 MG tablet Take 25 mg by mouth daily.    [provider]  metFORMIN (GLUCOPHAGE) 1000 MG tablet Take 1,000 mg by mouth 2 (two) times daily. 10/17/19   [provider]  metoprolol (LOPRESSOR) 100 MG tablet Take 100 mg by mouth 2 (two) times daily.    [provider]  Multiple Vitamin (MULTIVITAMIN WITH MINERALS) TABS tablet Take 1 tablet by mouth daily.    [provider]  predniSONE (DELTASONE) 10 MG tablet 2 tabs daily for 2 days 1 tab daily for 2 days 01/25/20   Dorcas Carrow, MD  triamcinolone ointment (KENALOG) 0.1  % Apply 1 application topically 3 (three) times daily as needed (Skin rash).  02/23/15   [provider]    Allergies    Patient has no known allergies.  Review of Systems   Review of Systems  Constitutional:       Per HPI, otherwise negative  HENT:       Per HPI, otherwise negative  Respiratory:       Per HPI, otherwise negative  Cardiovascular:       Per HPI, otherwise negative  Gastrointestinal: Negative for vomiting.  Endocrine:       Negative aside from HPI  Genitourinary:       Neg aside from HPI   Musculoskeletal:       Per HPI, otherwise negative  Skin: Negative.   Neurological: Negative for syncope.  Psychiatric/Behavioral: Positive for decreased concentration and sleep disturbance. The patient is nervous/anxious.     Physical Exam Updated Vital Signs BP (!) 181/89   Pulse 75   Temp 98.4 F (36.9 C) (Oral)   Resp 19   Ht 5\' 2"  (1.575 m)   Wt 97.5 kg   SpO2 97%   BMI 39.32 kg/m   Physical Exam Vitals and nursing note reviewed.  Constitutional:      General: She is not in acute distress.    Appearance: She is well-developed and well-nourished.  HENT:     Head: Normocephalic and atraumatic.  Eyes:     Extraocular Movements: EOM normal.     Conjunctiva/sclera: Conjunctivae normal.  Cardiovascular:     Rate and Rhythm: Normal rate and regular rhythm.  Pulmonary:     Effort: Pulmonary effort is normal. No respiratory distress.     Breath sounds: Normal breath sounds. No stridor.  Abdominal:     General: There is no distension.  Musculoskeletal:        General: No edema.  Skin:    General: Skin is warm and dry.  Neurological:     Mental Status: She is alert and oriented to person, place, and time.     Cranial Nerves: No cranial nerve deficit.  Psychiatric:        Mood and Affect: Mood and affect and mood normal.        Behavior: Behavior normal.        Thought Content: Thought content normal.     Comments: Though the patient acknowledges  that others think as she is having hallucinations, the patient states that she is convinced that there are other people or objects present, when others cannot see them.     ED Results / Procedures / Treatments   Labs (all labs ordered are listed, but only abnormal results are displayed) Labs Reviewed  COMPREHENSIVE METABOLIC PANEL - Abnormal; Notable for the following components:      Result Value   Glucose, Bld 117 (*)    Creatinine, Ser 1.20 (*)    GFR, Estimated 46 (*)    All other  components within normal limits  URINE CULTURE  ETHANOL  CBC WITH DIFFERENTIAL/PLATELET  URINALYSIS, ROUTINE W REFLEX MICROSCOPIC  CBC WITH DIFFERENTIAL/PLATELET    Procedures Procedures   Medications Ordered in ED Medications - No data to display  ED Course  I have reviewed the triage vital signs and the nursing notes.  Pertinent labs & imaging results that were available during my care of the patient were reviewed by me and considered in my medical decision making (see chart for details).  Elderly female presents with her daughter due to concern for worsening paranoia and hallucinations. Patient is awake, alert, pleasant.  She is mildly hypertensive, but not hemodynamically remarkable.  Patient's initial evaluation is reassuring, with no overt psychosis. Sepsis patient for occult infection versus progression of her paranoia contributing to her illness. Absent focal neuro complaints, no negation for additional advanced imaging, as she had MRI performed within the past week, has follow-up scheduled within 1 week. Patient will require follow-up on labs, possibly repeat evaluation. Dr. Lockie Mola is aware of the patient.   Final Clinical Impression(s) / ED Diagnoses Final diagnoses:  Hallucinations    Rx / DC Orders ED Discharge Orders    None       Gerhard Munch, MD 06/30/20 1640

## 2020-06-30 NOTE — ED Notes (Signed)
An After Visit Summary was printed and given to the patient. Discharge instructions given and no further questions at this time.  Pt leaving with daughter.  

## 2020-07-02 LAB — URINE CULTURE

## 2020-07-25 ENCOUNTER — Encounter: Payer: Self-pay | Admitting: Neurology

## 2020-09-23 ENCOUNTER — Encounter (HOSPITAL_BASED_OUTPATIENT_CLINIC_OR_DEPARTMENT_OTHER): Payer: Self-pay

## 2020-09-23 DIAGNOSIS — G4733 Obstructive sleep apnea (adult) (pediatric): Secondary | ICD-10-CM

## 2020-09-23 DIAGNOSIS — G4710 Hypersomnia, unspecified: Secondary | ICD-10-CM

## 2020-09-23 DIAGNOSIS — R0683 Snoring: Secondary | ICD-10-CM

## 2020-11-04 ENCOUNTER — Ambulatory Visit: Payer: Medicare Other | Admitting: Neurology

## 2020-11-04 ENCOUNTER — Other Ambulatory Visit: Payer: Self-pay

## 2020-11-17 ENCOUNTER — Other Ambulatory Visit: Payer: Self-pay

## 2020-11-17 ENCOUNTER — Ambulatory Visit (HOSPITAL_BASED_OUTPATIENT_CLINIC_OR_DEPARTMENT_OTHER): Payer: Medicare Other | Attending: Internal Medicine | Admitting: Sleep Medicine

## 2020-11-17 DIAGNOSIS — G4733 Obstructive sleep apnea (adult) (pediatric): Secondary | ICD-10-CM | POA: Insufficient documentation

## 2020-11-17 DIAGNOSIS — R0683 Snoring: Secondary | ICD-10-CM

## 2020-11-17 DIAGNOSIS — G4710 Hypersomnia, unspecified: Secondary | ICD-10-CM

## 2020-11-26 ENCOUNTER — Ambulatory Visit: Payer: Medicare Other | Admitting: Neurology

## 2021-01-05 NOTE — Procedures (Signed)
NAME: Christy Hanson DATE OF BIRTH:  March 16, 1941 MEDICAL RECORD NUMBER [id number]  LOCATION: [facility]  PHYSICIAN:  D   DATE OF STUDY: 11/17/2020  SLEEP STUDY TYPE: Split Night Study               REFERRING PHYSICIAN: Alanda Slim, MD  CLINICAL INFORMATION Christy Hanson is a 80 year old Female and was referred to the sleep center for evaluation of G47.80 Other Sleep Disorders. Indications include Diabetes, Excessive Daytime Sleepiness, Fatigue, Hypertension, Obesity, Re-Evaluation, Snoring.  MEDICATIONS Patient self administered medications include: Losartan, Metformin, Metoprolol, Jardiance. No sleep medicine administered.  SLEEP STUDY TECHNIQUE The patient underwent an attended overnight level one polysomnography titration to assess the effects of CPAP therapy. The following variables were monitored: EEG (C4-A1, C3-A2, O1-A2, O2-A1), EOG, submental and leg EMG, ECG, oxyhemoglobin saturation by pulse oximetry, thoracic and abdominal respiratory effort belts, nasal/oral airflow by pressure sensor, body position sensor and snoring sensor. CPAP pressure was titrated to eliminate apneas, hypopneas and oxygen desaturation. Hypopneas were scored per AASM definition IB (4% desaturation). The NPSG portion of the study ended at 1:41:32 AM. The CPAP titration was initiated at 1:56:41 AM AM with the CPAP portion of the study ending at 5:16:48 AM.  TECHNICIAN COMMENTS Comments added by Technician: None Comments added by Scorer: N/A SLEEP ARCHITECTURE The recording time for the entire night was 411.6 minutes. The diagnostic portion was initiated at 10:25:12 PM and terminated at 1:41:32 AM. The time in bed was 196.3 minutes. EEG confirmed total sleep time was 180.2 minutes yielding a sleep efficiency of 91.8%%. Sleep onset after lights out was 2.6 minutes with a REM latency of 81.0 minutes. The patient spent 4.4%% of the night in stage N1 sleep, 89.5%% in  stage N2 sleep, 0.0%% in stage N3 and 6.1% in REM. The Arousal Index was 32.6/hour.  The titration portion was initiated at 1:56:41 AM and terminated at 5:16:48 AM. The time in bed was 200.1 minutes. EEG confirmed total sleep time was 150.0 minutes yielding a sleep efficiency of 75.0%%. Sleep onset after CPAP initiation was 19.1 minutes with a REM latency of 83.5 minutes. The patient spent 6.7%% of the night in stage N1 sleep, 71.7%% in stage N2 sleep, 0.0%% in stage N3 and 21.7% in REM. The Arousal Index was 11.6/hour. RESPIRATORY PARAMETERS During the diagnostic portion, there were a total of 122 respiratory disturbances recorded; 35 apneas (34 obstructive, 0 mixed, 1 central), 82 hypopneas and 5 RERAs. The apnea/hypopnea index 39.0 was events/hour and the RDI was 40.6 events/hour. The central sleep apnea index was 0.3 events/hour. The REM AHI was 60.0/h and NREM AHI was 37.6/h. The REM RDI was 65.5/h and NREM RDI was 39.0/h. The supine AHI was 62.0/h, and the non supine AHI was 15/h; supine during 51.0%% of sleep. The supine RDI was 64.6 /h, and the non supine RDI was 15.64/h. Respiratory disturbances were associated with oxygen desaturation down to a nadir of 86.0% during sleep. The mean oxygen saturation during the study was 96.2%. The cumulative time under 88% oxygen saturation was 0.8 minutes.  During the titration portion, the apnea/hypopnea index (AHI) was 0.0 events/hour and the RDI was 0.0 events/hour, at a CPAP pressure setting of 10 cm H2O. The central sleep apnea index was events/hour. LEG MOVEMENT DATA The periodic limb movement index was 0.0/hour with an associated arousal index of 2.7/hour. CARDIAC DATA The underlying cardiac rhythm was most consistent with sinus rhythm. Mean heart rate was 73.3  during diagnostic portion and 63.1 during titration portion of study. DIAGNOSIS - Severe Obstructive Sleep Apnea (G47.33) - Morbid Obesity (E66.01) RECOMMENDATIONS - Trial of CPAP therapy on  10 cm H2O with a small size Airfit N30i nasal mask and heated humidification. - Avoid alcohol, sedatives and other CNS depressants that may worsen sleep apnea and disrupt normal sleep architecture. - Sleep hygiene should be reviewed to assess factors that may improve sleep quality. - Weight management and regular exercise should be initiated or continued.    D  Diplomate, American Board of Internal Medicine  ELECTRONICALLY SIGNED ON:  01/05/2021, 3:32 PM [facility] PH: [phone]   FX: [phone] ACCREDITED BY THE AMERICAN ACADEMY OF SLEEP MEDICINE

## 2021-01-20 ENCOUNTER — Ambulatory Visit (INDEPENDENT_AMBULATORY_CARE_PROVIDER_SITE_OTHER): Payer: Medicare Other | Admitting: Neurology

## 2021-01-20 ENCOUNTER — Encounter: Payer: Self-pay | Admitting: Neurology

## 2021-01-20 ENCOUNTER — Other Ambulatory Visit: Payer: Self-pay

## 2021-01-20 VITALS — BP 179/75 | HR 70 | Ht 62.0 in | Wt 212.8 lb

## 2021-01-20 DIAGNOSIS — R441 Visual hallucinations: Secondary | ICD-10-CM

## 2021-01-20 NOTE — Patient Instructions (Signed)
Good to meet you. Schedule Neurocognitive testing to assess for underlying neurological cause of the hallucinations. Follow-up after testing, call for any changes.   You have been referred for a neuropsychological evaluation (i.e., evaluation of memory and thinking abilities). Please bring someone with you to this appointment if possible, as it is helpful for the doctor to hear from both you and another adult who knows you well. Please bring eyeglasses and hearing aids if you wear them.    The evaluation will take approximately 3 hours and has two parts:   The first part is a clinical interview with the neuropsychologist (Dr. Milbert Coulter or Dr. Roseanne Reno). During the interview, the neuropsychologist will speak with you and the individual you brought to the appointment.    The second part of the evaluation is testing with the doctor's technician Annabelle Harman or Selena Batten). During the testing, the technician will ask you to remember different types of material, solve problems, and answer some questionnaires. Your family member will not be present for this portion of the evaluation.   Please note: We must reserve several hours of the neuropsychologist's time and the psychometrician's time for your evaluation appointment. As such, there is a No-Show fee of $100. If you are unable to attend any of your appointments, please contact our office as soon as possible to reschedule.

## 2021-01-20 NOTE — Progress Notes (Signed)
NEUROLOGY CONSULTATION NOTE  Christy Hanson MRN: [id number] DOB: March 22, 1941  Referring provider: [facility] Primary care provider: Jarrett Soho, PA  Reason for consult:  memory loss   Thank you for your kind referral of Christy Hanson for consultation of the above symptoms. Although her history is well known to you, please allow me to reiterate it for the purpose of our medical record. The patient was accompanied to the clinic by her daughter Christy Hanson who also provides collateral information. Records and images were personally reviewed where available.   HISTORY OF PRESENT ILLNESS: This is an 80 year old right-handed woman with a history of hypertension, hyperlipidemia, diabetes, OCS, presenting for evaluation of visual hallucinations and memory loss.  She is accompanied by her daughter Christy Hanson who helps supplement the history today. Records were reviewed. She thinks her memory is good. Christy Hanson agrees and states that to her, patient does not have memory issues. She lives alone. Main concern has been hallucinations that started a couple of years ago. No prior psychiatric history. She started seeing snakes, a fish/pond in the living room, people outside the window, somebody inside the cabinet, her deceased husband, her other daughter who was not in the same house, children sitting on the table. No auditory component. She was consistently having them at night, but would also occur in the daytime. She initially knew they were not real, however when they started feeling real to her that she was getting agitated, they decided to bring her to the ER on 10/2019. 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 She was started on Seroquel 50mg  qhs. reports that she stopped the Seroquel because it seemed like it heightened the hallucinations. She has not been complaining about the hallucinations in a while, however she states  that every once in a while she thought she had seen a snake or someone outside the window. She manages her own medications and may take them a little late but denies forgetting to take them. She manages finances, Christy Hanson helps and reports she does well with this. She does not drive. She denies misplacing things frequently or leaving the stove on. She is independent with dressing and bathing, no hygiene concerns. She feels her vision is okay. She denies any headaches, dizziness, neck/back pain, bowel/bladder dysfunction, anosmia, or tremors. Her left hand is constantly stiff, numb, with a hard time picking things up sometimes. Sleep is good, but Christy Hanson does not feel so, she has not had her CPAP due to the recall. No REM behavior disorder. No family history of dementia, no history of significant head injuries or alcohol use. She feels her mood is okay. She was evaluated by neurologist Dr. Tokelau last summer, records unavailable for review. She had an MRI brain without contrast in 04/2020 which did not show any acute changes, brain within normal limits for age with mild senescent changes. 05/2020 reports he wanted her to go to a tertiary center, it appears for Neuropsychological evaluation.    PAST MEDICAL HISTORY: Past Medical History:  Diagnosis Date   Diabetes mellitus without complication (HCC)    Hallucinations    High cholesterol    Hypertension    Thyroid disease     PAST SURGICAL HISTORY: Past Surgical History:  Procedure Laterality Date   gallstone     HERNIA REPAIR      MEDICATIONS: Current Outpatient Medications on File Prior to Visit  Medication Sig Dispense Refill   brimonidine (  ALPHAGAN) 0.15 % ophthalmic solution Place 1 drop into both eyes 2 (two) times daily.     diclofenac Sodium (VOLTAREN) 1 % GEL Apply 1 application topically 2 (two) times daily as needed for pain.     dorzolamide-timolol (COSOPT) 22.3-6.8 MG/ML ophthalmic solution Place 1 drop into both eyes 2 (two)  times daily.  4   ferrous sulfate 325 (65 FE) MG EC tablet Take 325 mg by mouth daily with breakfast.     furosemide (LASIX) 40 MG tablet Take 40 mg by mouth daily as needed for fluid or edema.   3   latanoprost (XALATAN) 0.005 % ophthalmic solution Place 1 drop into both eyes at bedtime.     LEVEMIR FLEXTOUCH 100 UNIT/ML Pen Inject 40 Units into the skin 2 (two) times daily.  3   losartan (COZAAR) 25 MG tablet Take 25 mg by mouth daily.     metFORMIN (GLUCOPHAGE) 1000 MG tablet Take 1,000 mg by mouth 2 (two) times daily.     metoprolol (LOPRESSOR) 100 MG tablet Take 100 mg by mouth 2 (two) times daily.     Multiple Vitamin (MULTIVITAMIN WITH MINERALS) TABS tablet Take 1 tablet by mouth daily.     predniSONE (DELTASONE) 10 MG tablet 2 tabs daily for 2 days 1 tab daily for 2 days 6 tablet 0   triamcinolone ointment (KENALOG) 0.1 % Apply 1 application topically 3 (three) times daily as needed (Skin rash).      No current facility-administered medications on file prior to visit.    ALLERGIES: No Known Allergies  FAMILY HISTORY: Family History  Problem Relation Age of Onset   Diabetes Mellitus II Brother     SOCIAL HISTORY: Social History   Socioeconomic History   Marital status: Widowed    Spouse name: Not on file   Number of children: Not on file   Years of education: Not on file   Highest education level: Not on file  Occupational History   Not on file  Tobacco Use   Smoking status: Never   Smokeless tobacco: Never  Vaping Use   Vaping Use: Never used  Substance and Sexual Activity   Alcohol use: No   Drug use: No   Sexual activity: Not on file  Other Topics Concern   Not on file  Social History Narrative   Not on file   Social Determinants of Health   Financial Resource Strain: Not on file  Food Insecurity: Not on file  Transportation Needs: Not on file  Physical Activity: Not on file  Stress: Not on file  Social Connections: Not on file  Intimate Partner  Violence: Not on file     PHYSICAL EXAM: Vitals:   01/20/21 0908  BP: (!) 179/75  Pulse: 70  SpO2: 99%   General: No acute distress Head:  Normocephalic/atraumatic Skin/Extremities: No rash, no edema Neurological Exam: Mental status: alert and oriented to person, place, and time, no dysarthria or aphasia, Fund of knowledge is appropriate.  Recent and remote memory are  fair.  Attention and concentration are reduced.    Able to name objects, difficulty with repetition. Difficulty with visuospatial/executive function tasks. MOCA 17/30. Montreal Cognitive Assessment  01/20/2021  Visuospatial/ Executive (0/5) 2  Naming (0/3) 3  Attention: Read list of digits (0/2) 2  Attention: Read list of letters (0/1) 0  Attention: Serial 7 subtraction starting at 100 (0/3) 0  Language: Repeat phrase (0/2) 0  Language : Fluency (0/1) 0  Abstraction (0/2) 0  Delayed Recall (0/5) 3  Orientation (0/6) 6  Total 16  Adjusted Score (based on education) 17    Cranial nerves: CN I: not tested CN II: pupils equal, round and reactive to light, visual fields intact CN III, IV, VI:  full range of motion, no nystagmus, no ptosis CN V: facial sensation intact CN VII: upper and lower face symmetric CN VIII: hearing intact to conversation CN IX, X: gag intact, uvula midline CN XI: sternocleidomastoid and trapezius muscles intact CN XII: tongue midline Bulk & Tone: normal, no fasciculations, no cogwheeling Motor: 5/5 throughout with no pronator drift. Sensation: intact to light touch, cold, pin, vibration sense.  No extinction to double simultaneous stimulation.  Romberg test negative Deep Tendon Reflexes: +1 throughout Cerebellar: no incoordination on finger to nose testing Gait: slightly favoring right knee due to pain, fair arm swing, no ataxia Tremor: none   IMPRESSION: his is an 80 year old right-handed woman with a history of hypertension, hyperlipidemia, diabetes, OCS, presenting for evaluation  of visual hallucinations and memory loss. She and her daughter feel she does not have memory issues, main concern are the hallucinations. Her MOCA score today is 17/30, more difficulty with other tasks rather than memory. Neurological exam did not show any evidence of parkinsonism. 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 From a neurological standpoint, Lewy body dementia has a prominent component of visual hallucinations, however they deny any cognitive issues, no parkinsonian signs or REM behavior disorder. She will be scheduled for Neurocognitive testing to further delineate her symptoms. Since hallucinations are not as bothersome at this time, we have agreed to holding off on starting medication. Follow-up after testing, they know to call for any changes.   Thank you for allowing me to participate in the care of this patient. Please do not hesitate to call for any questions or concerns.   Patrcia Dolly, M.D.  CC: Christy Soho, PA

## 2021-01-21 ENCOUNTER — Other Ambulatory Visit: Payer: Self-pay | Admitting: Family Medicine

## 2021-01-21 DIAGNOSIS — E2839 Other primary ovarian failure: Secondary | ICD-10-CM

## 2021-06-22 ENCOUNTER — Encounter: Payer: Medicare Other | Admitting: Psychology

## 2021-06-22 ENCOUNTER — Encounter: Payer: Self-pay | Admitting: Psychology

## 2021-06-22 DIAGNOSIS — R441 Visual hallucinations: Secondary | ICD-10-CM | POA: Insufficient documentation

## 2021-06-22 DIAGNOSIS — R400 Somnolence: Secondary | ICD-10-CM | POA: Insufficient documentation

## 2021-06-22 DIAGNOSIS — I509 Heart failure, unspecified: Secondary | ICD-10-CM | POA: Insufficient documentation

## 2021-06-22 DIAGNOSIS — M159 Polyosteoarthritis, unspecified: Secondary | ICD-10-CM | POA: Insufficient documentation

## 2021-06-22 DIAGNOSIS — E079 Disorder of thyroid, unspecified: Secondary | ICD-10-CM | POA: Insufficient documentation

## 2021-06-22 DIAGNOSIS — E7800 Pure hypercholesterolemia, unspecified: Secondary | ICD-10-CM | POA: Insufficient documentation

## 2021-06-22 DIAGNOSIS — G4733 Obstructive sleep apnea (adult) (pediatric): Secondary | ICD-10-CM | POA: Insufficient documentation

## 2021-06-22 DIAGNOSIS — R2689 Other abnormalities of gait and mobility: Secondary | ICD-10-CM | POA: Insufficient documentation

## 2021-06-22 DIAGNOSIS — E2839 Other primary ovarian failure: Secondary | ICD-10-CM | POA: Insufficient documentation

## 2021-07-01 ENCOUNTER — Encounter: Payer: Medicare Other | Admitting: Psychology

## 2021-07-10 ENCOUNTER — Other Ambulatory Visit: Payer: Medicare Other

## 2021-07-17 ENCOUNTER — Ambulatory Visit: Payer: Medicare Other | Admitting: Neurology

## 2021-08-05 ENCOUNTER — Ambulatory Visit: Payer: Medicare Other | Admitting: Neurology

## 2021-10-12 ENCOUNTER — Encounter: Payer: Self-pay | Admitting: Podiatry

## 2021-10-12 ENCOUNTER — Ambulatory Visit (INDEPENDENT_AMBULATORY_CARE_PROVIDER_SITE_OTHER): Payer: Medicare Other | Admitting: Podiatry

## 2021-10-12 DIAGNOSIS — N1831 Chronic kidney disease, stage 3a: Secondary | ICD-10-CM | POA: Diagnosis not present

## 2021-10-12 DIAGNOSIS — M79674 Pain in right toe(s): Secondary | ICD-10-CM | POA: Diagnosis not present

## 2021-10-12 DIAGNOSIS — M79675 Pain in left toe(s): Secondary | ICD-10-CM | POA: Diagnosis not present

## 2021-10-12 DIAGNOSIS — B351 Tinea unguium: Secondary | ICD-10-CM | POA: Diagnosis not present

## 2021-10-12 DIAGNOSIS — E1129 Type 2 diabetes mellitus with other diabetic kidney complication: Secondary | ICD-10-CM

## 2021-10-12 NOTE — Progress Notes (Signed)
This patient presents to the office with chief complaint of long thick nails and diabetic feet.  This patient  says there  is  no pain and discomfort in her  feet.  This patient says there are long thick painful nails.  These nails are painful walking and wearing shoes.  Patient has no history of infection or drainage from both feet.  Patient is unable to  self treat his own nails . This patient presents  to the office today for treatment of the  long nails and a foot evaluation due to history of  diabetes.  General Appearance  Alert, conversant and in no acute stress.  Vascular  Dorsalis pedis and posterior tibial  pulses are weakly palpable absent   bilaterally.  Capillary return is within normal limits  bilaterally. Temperature is within normal limits  bilaterally.  Neurologic  Senn-Weinstein monofilament wire test within normal limits  bilaterally. Muscle power within normal limits bilaterally.  Nails Thick disfigured discolored nails with subungual debris  from hallux to fifth toes bilaterally. No evidence of bacterial infection or drainage bilaterally. Pincer nails  B/L.  Orthopedic  No limitations of motion of motion feet .  No crepitus or effusions noted.  No bony pathology or digital deformities noted.  Hallux limitus 1st MPJ  B/L.  Skin  normotropic skin with no porokeratosis noted bilaterally.  No signs of infections or ulcers noted.     Onychomycosis  Diabetes with neuropathy.  IE  Debride nails x 10.  A diabetic foot exam was performed and there is no evidence of any neurologic  pathology.  Vascular pathology noted.  Patient requests diabetic shoes but I told her that she does not qualify due LOPS  WNL.  RTC 3 months.   Helane Gunther DPM

## 2022-05-06 IMAGING — CT CT HEAD W/O CM
1 series · 16 of 30 positions shown, 20 images · non-contrast
Comparison: None.

CLINICAL DATA: Hallucinations.

EXAM:
CT HEAD WITHOUT CONTRAST
TECHNIQUE: Contiguous axial images were obtained from the base of the skull
through the vertex without intravenous contrast.

[Series 3: head 5.0 h30s · axial · 0.46mm/px · z∈[-117,+18]mm · 16 of 31 slices shown, 20 images]
[im 2/31  brain]
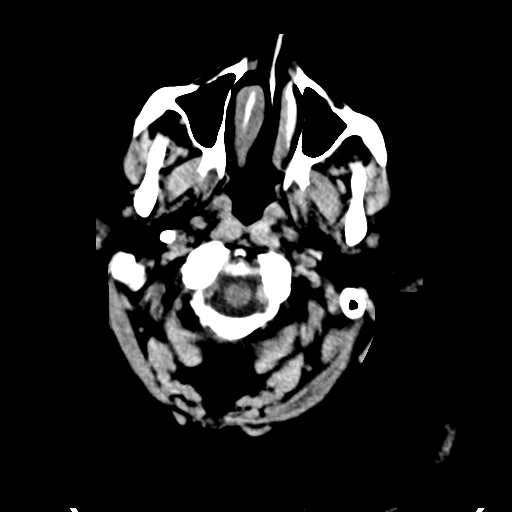
[im 2/31  bone]
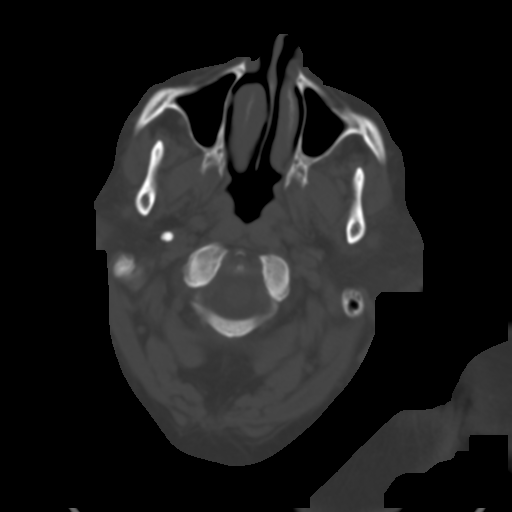
[im 4/31  brain]
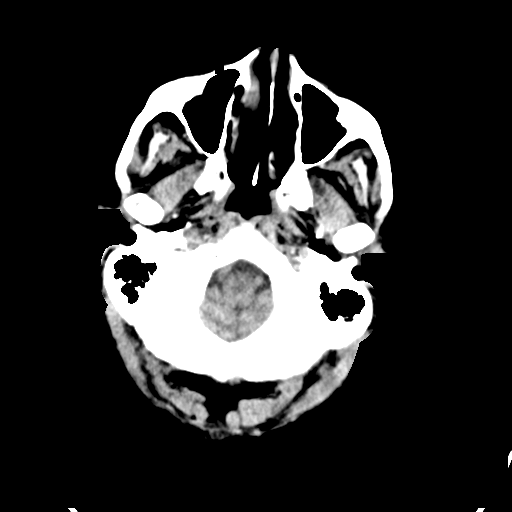
[im 6/31  brain]
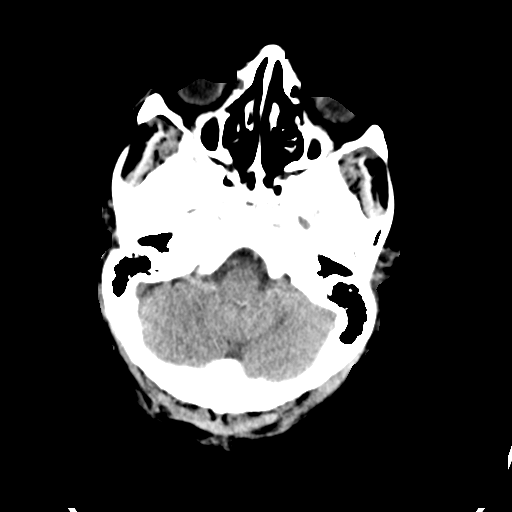
[im 8/31  brain]
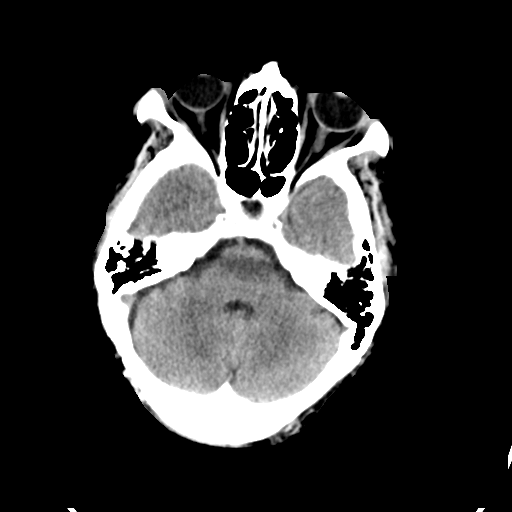
[im 9/31  brain]
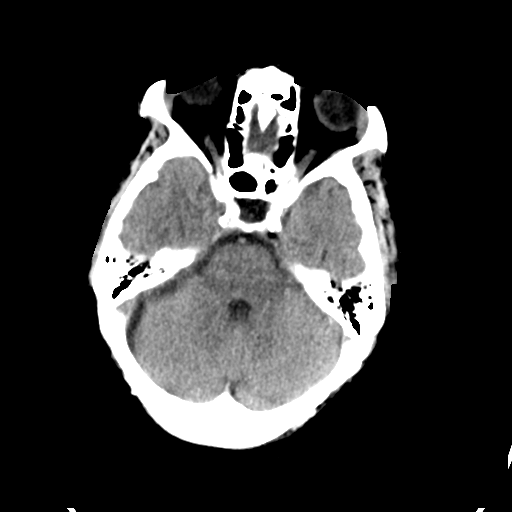
[im 9/31  bone]
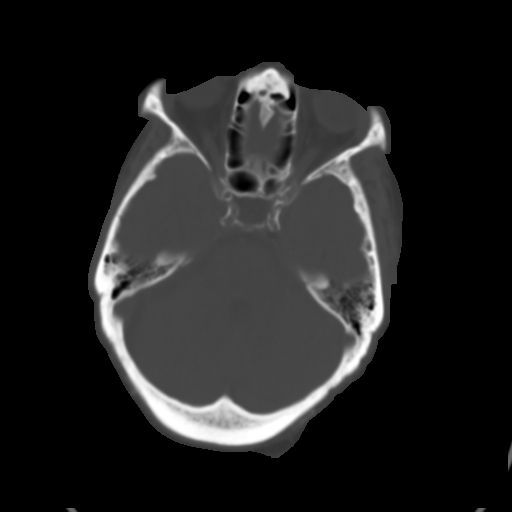
[im 11/31  brain]
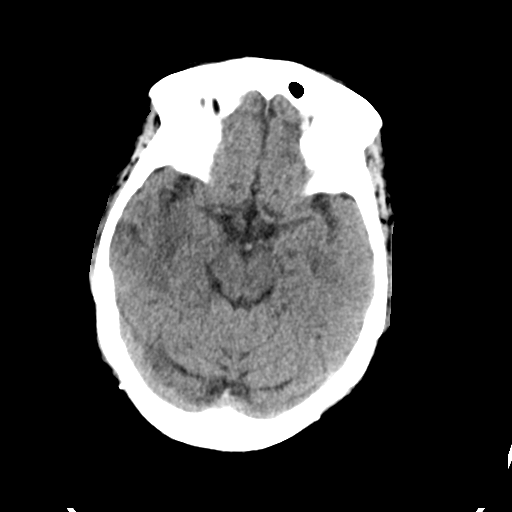
[im 13/31  brain]
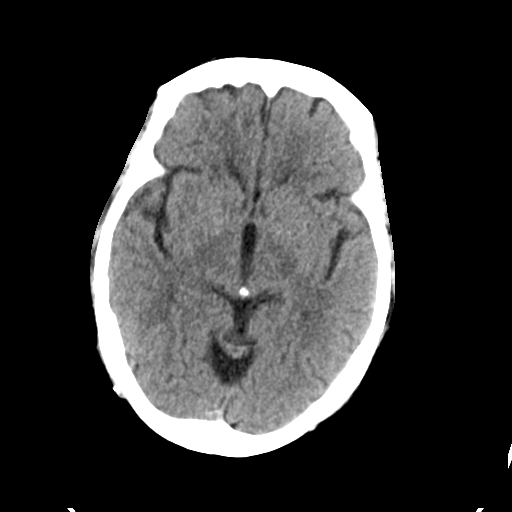
[im 15/31  brain]
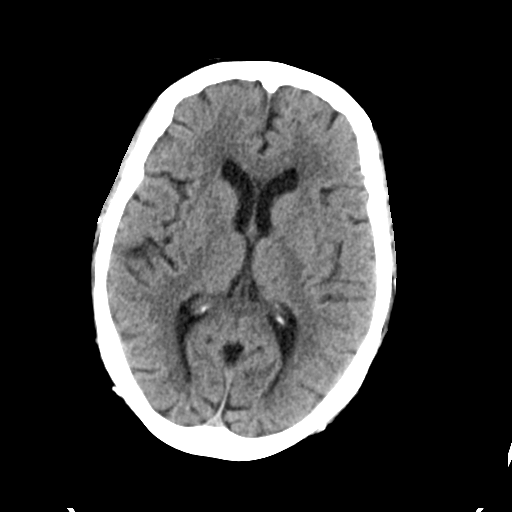
[im 16/31  brain]
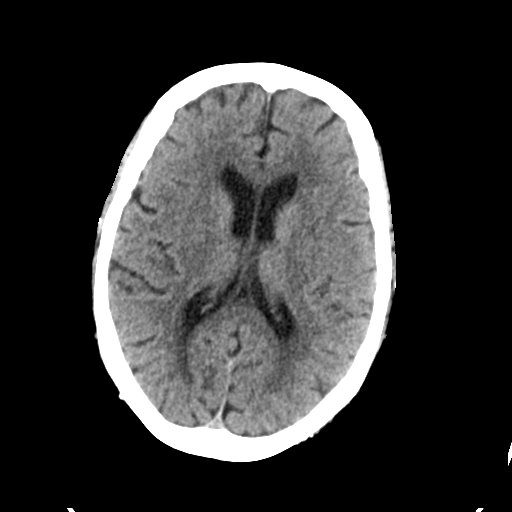
[im 16/31  bone]
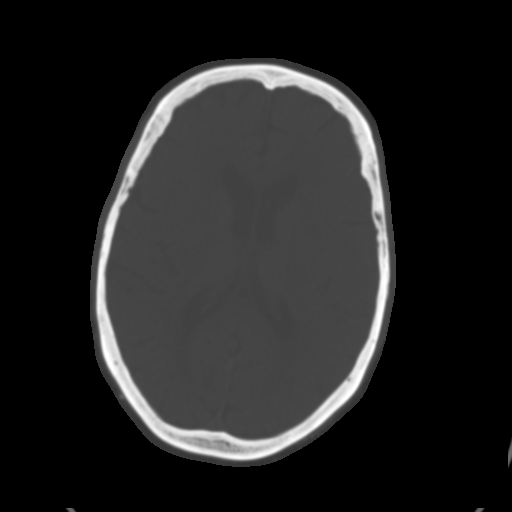
[im 18/31  brain]
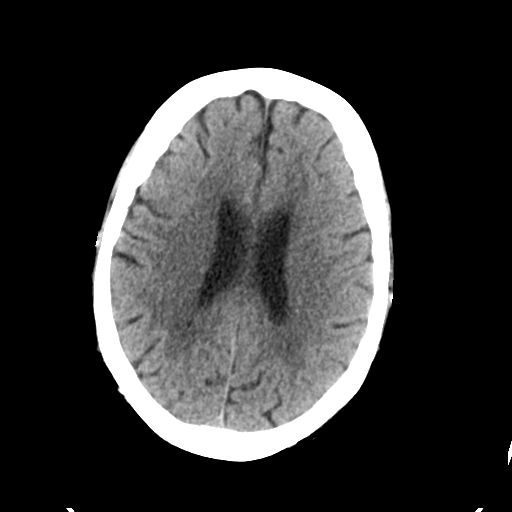
[im 20/31  brain]
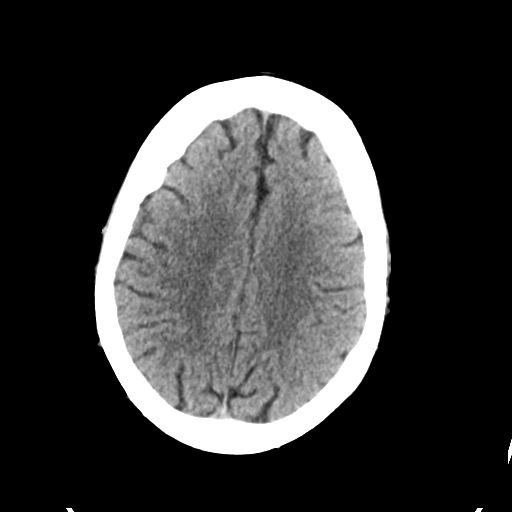
[im 22/31  brain]
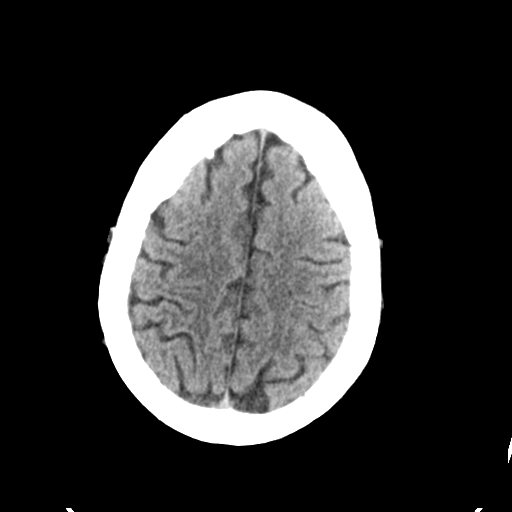
[im 23/31  brain]
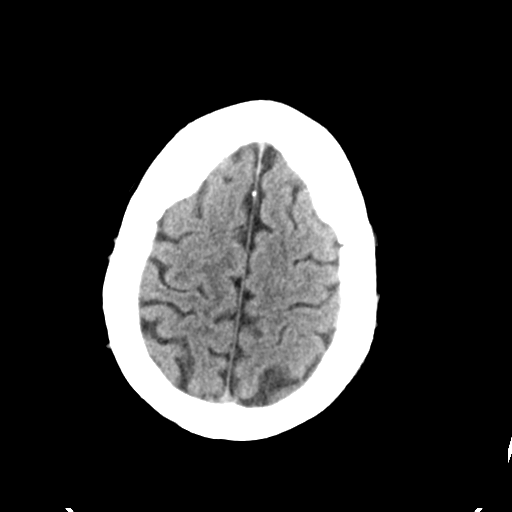
[im 23/31  bone]
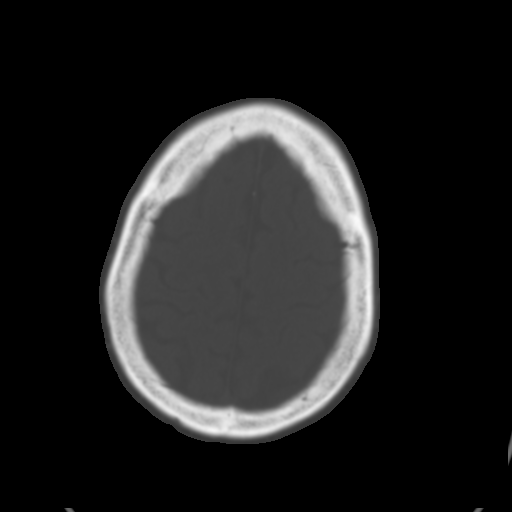
[im 25/31  brain]
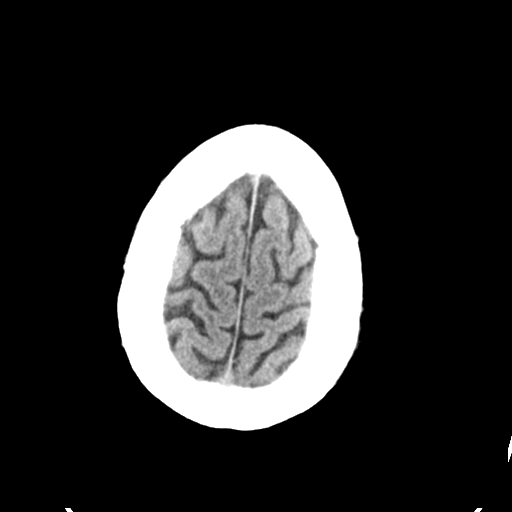
[im 27/31  brain]
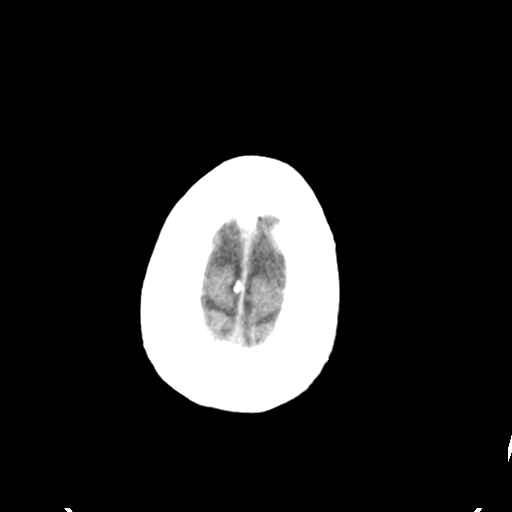
[im 29/31  brain]
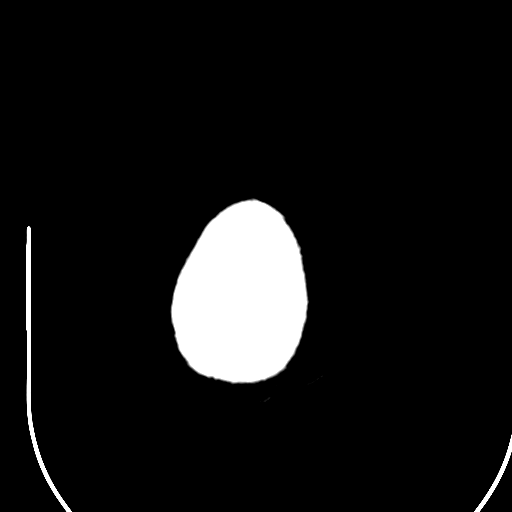

[16 of 30 positions shown; findings below may reference images not displayed]

FINDINGS: Brain: There is mild cerebral atrophy with widening of the
extra-axial spaces and ventricular dilatation.
There are areas of decreased attenuation within the white matter
tracts of the supratentorial brain, consistent with microvascular
disease changes.

Vascular: No hyperdense vessel or unexpected calcification.

Skull: Normal. Negative for fracture or focal lesion.

Sinuses/Orbits: No acute finding.

Other: None.
IMPRESSION: 1. Generalized cerebral atrophy.
2. No acute intracranial abnormality.

## 2022-06-29 ENCOUNTER — Encounter: Payer: Self-pay | Admitting: Psychology

## 2022-06-30 ENCOUNTER — Encounter: Payer: Medicaid Other | Admitting: Psychology

## 2022-07-08 ENCOUNTER — Encounter: Payer: Medicaid Other | Admitting: Psychology

## 2022-07-10 IMAGING — DX DG CHEST 1V PORT
1 series · 1 of 1 positions shown · non-contrast
Comparison: 10/22/2012

CLINICAL DATA: Cough and shortness of breath, history of recent
COVID exposure

EXAM:
PORTABLE CHEST 1 VIEW

[chest ap]
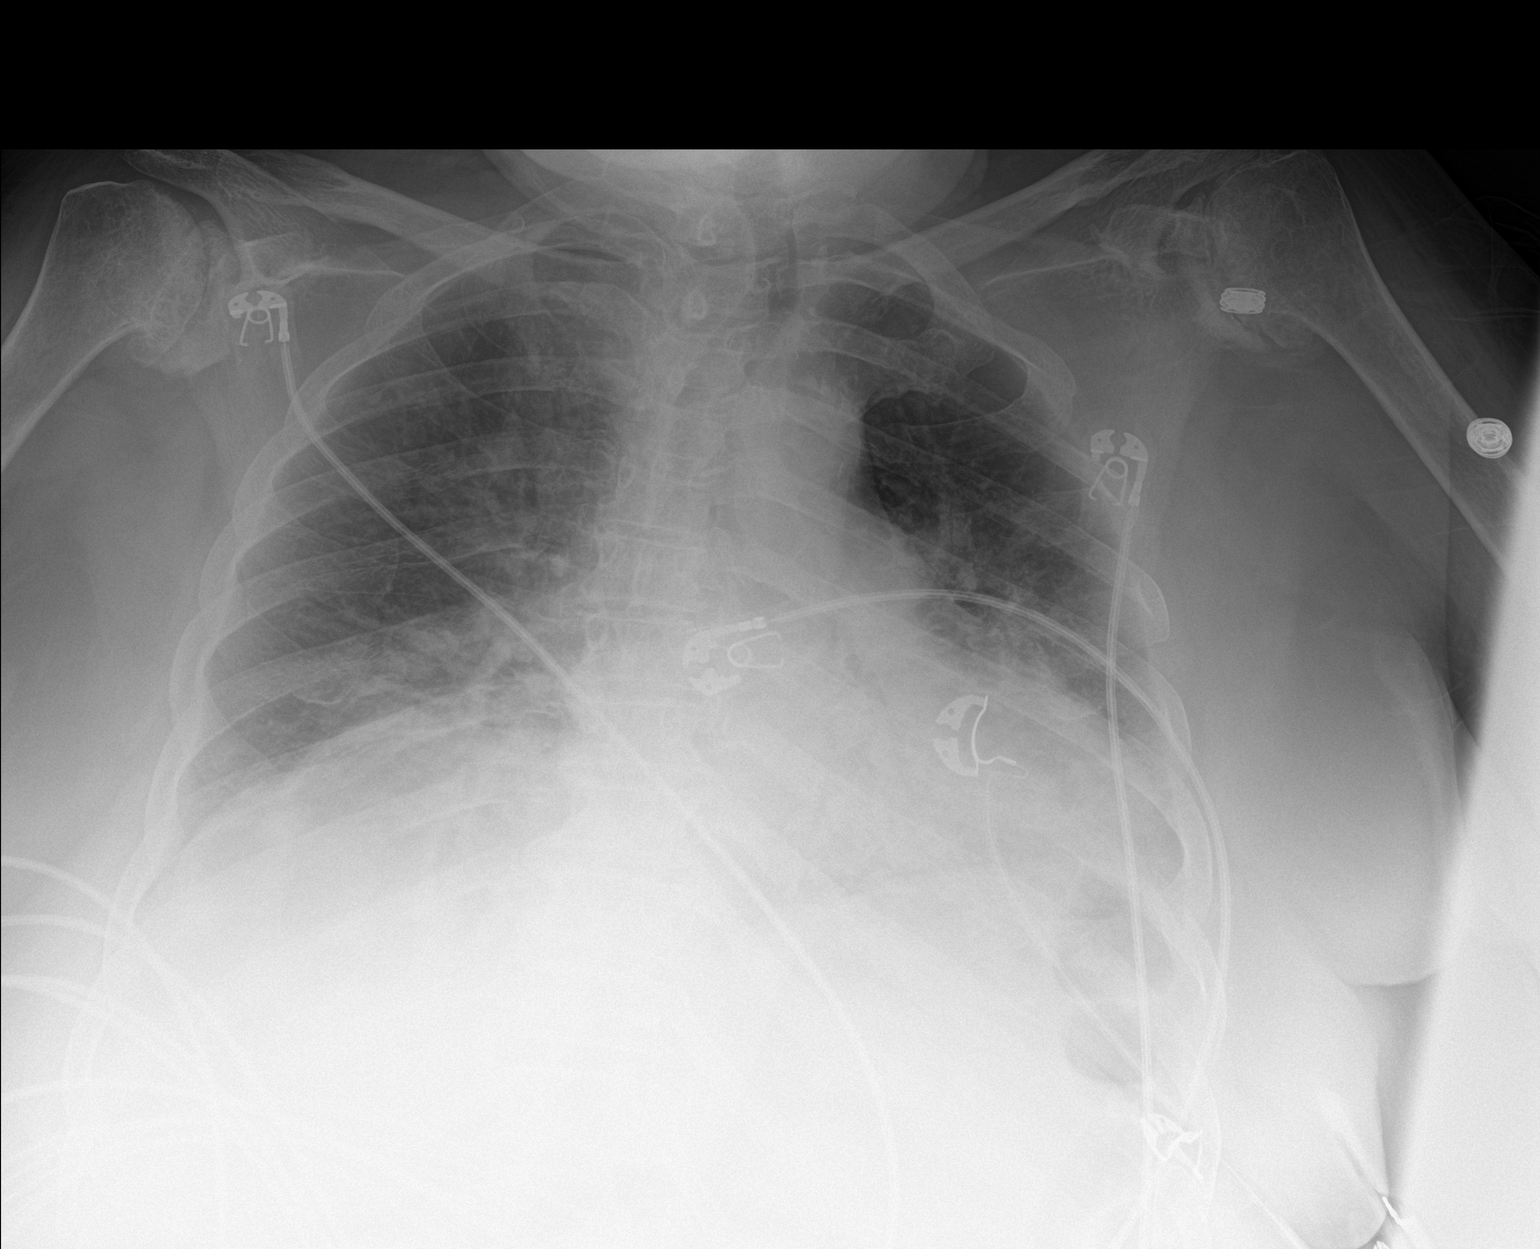

[1 of 1 positions shown; findings below may reference images not displayed]

FINDINGS: Cardiac shadow is at the upper limits of normal in size. Mild
bibasilar airspace opacities are noted. Correlate with [id number]
testing. No sizable effusion is seen. No bony abnormality is noted.
IMPRESSION: Bibasilar airspace opacities.  Correlate with COVID testing

## 2022-07-14 IMAGING — DX DG CHEST 1V PORT
1 series · 1 of 1 positions shown · non-contrast
Comparison: 01/18/2020

CLINICAL DATA: Shortness of breath

EXAM:
PORTABLE CHEST 1 VIEW

[chest ap]
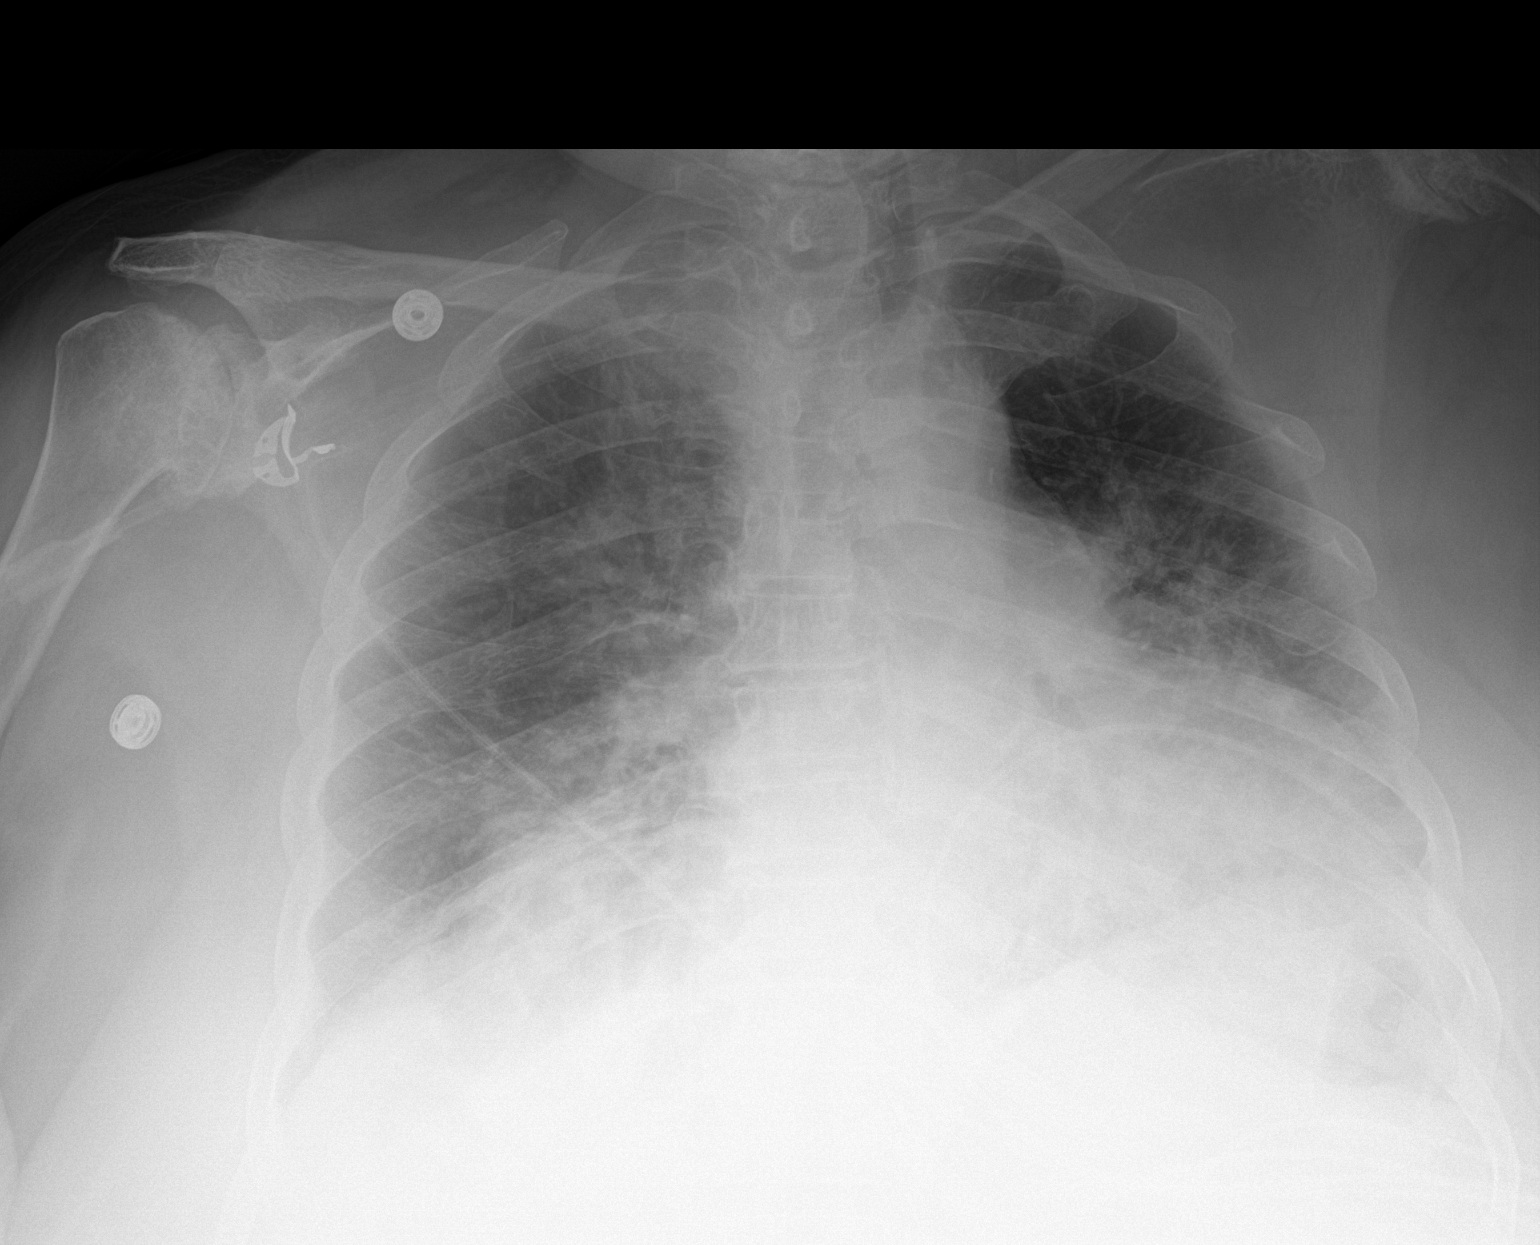

[1 of 1 positions shown; findings below may reference images not displayed]

FINDINGS: Similar appearance of perihilar and basilar airspace disease. No
pleural effusion. Stable cardiomediastinal silhouette. No
pneumothorax.
IMPRESSION: No significant interval change in perihilar and basilar airspace
disease presumably representing bilateral pneumonia.

## 2023-04-25 ENCOUNTER — Ambulatory Visit: Payer: Medicare Other

## 2023-04-25 ENCOUNTER — Institutional Professional Consult (permissible substitution): Payer: Medicare Other | Admitting: Psychology

## 2023-05-02 ENCOUNTER — Encounter: Payer: Medicare Other | Admitting: Psychology

## 2023-08-21 ENCOUNTER — Other Ambulatory Visit: Payer: Self-pay

## 2023-08-21 ENCOUNTER — Emergency Department (HOSPITAL_COMMUNITY)
Admission: EM | Admit: 2023-08-21 | Discharge: 2023-08-21 | Disposition: A | Discharge status: Home / Self Care | Payer: Medicare (Managed Care) | Attending: Emergency Medicine | Admitting: Emergency Medicine

## 2023-08-21 DIAGNOSIS — R030 Elevated blood-pressure reading, without diagnosis of hypertension: Secondary | ICD-10-CM | POA: Diagnosis present

## 2023-08-21 DIAGNOSIS — Z79899 Other long term (current) drug therapy: Secondary | ICD-10-CM | POA: Insufficient documentation

## 2023-08-21 DIAGNOSIS — I10 Essential (primary) hypertension: Secondary | ICD-10-CM | POA: Insufficient documentation

## 2023-08-21 DIAGNOSIS — Z7984 Long term (current) use of oral hypoglycemic drugs: Secondary | ICD-10-CM | POA: Insufficient documentation

## 2023-08-21 LAB — BASIC METABOLIC PANEL WITH GFR
Anion gap: 9 (ref 5–15)
BUN: 24 mg/dL — ABNORMAL HIGH (ref 8–23)
CO2: 22 mmol/L (ref 22–32)
Calcium: 9.7 mg/dL (ref 8.9–10.3)
Chloride: 104 mmol/L (ref 98–111)
Creatinine, Ser: 1.41 mg/dL — ABNORMAL HIGH (ref 0.44–1.00)
GFR, Estimated: 37 mL/min — ABNORMAL LOW (ref >60)
Glucose, Bld: 138 mg/dL — ABNORMAL HIGH (ref 70–99)
Potassium: 4.1 mmol/L (ref 3.5–5.1)
Sodium: 135 mmol/L (ref 135–145)

## 2023-08-21 LAB — CBC
HCT: 42.2 % (ref 36.0–46.0)
Hemoglobin: 13.5 g/dL (ref 12.0–15.0)
MCH: 27.2 pg (ref 26.0–34.0)
MCHC: 32 g/dL (ref 30.0–36.0)
MCV: 85.1 fL (ref 80.0–100.0)
Platelets: 189 10*3/uL (ref 150–400)
RBC: 4.96 MIL/uL (ref 3.87–5.11)
RDW: 15.2 % (ref 11.5–15.5)
WBC: 7.7 10*3/uL (ref 4.0–10.5)
nRBC: 0 % (ref 0.0–0.2)

## 2023-08-21 NOTE — Discharge Instructions (Signed)
 Please restart your home medications. Turn if you are having new or worsening symptoms Follow-up with your doctor this week

## 2023-08-21 NOTE — ED Triage Notes (Signed)
 Patient presents POV for hypertension. Was out of medication x 2 days due to changing pharmacies but has now picked up her prescription this morning and has no barriers to taking previously prescribed medication. Patient unsure how high her BP has been at home. Denies chest pain or headache.

## 2023-08-21 NOTE — ED Provider Notes (Signed)
 [place] EMERGENCY DEPARTMENT AT [facility] Provider Note   CSN: [id number] Arrival date & time: 08/21/23  1313     History  Chief Complaint  Patient presents with   Hypertension    Christy Hanson is a 83 y.o. female.  HPI 83 year old female history of high blood pressure presents today to evaluate hypertension.  Patient is here with her daughter.  Patient was in [employer] picking up her blood pressure medicines.  Patient had blood pressure taken at [facility] and it was elevated at 190.  She has had some right shoulder pain and mild dyspnea otherwise has not had any complaints.  Last Monday is reported that her blood pressure was elevated 200 Christy Hanson was checked at home but then decreased back to 150.  She has not had any chest pain, headache, or weakness.  She has not taken her blood pressure medications for several days due to being out of it.     Home Medications Prior to Admission medications   Medication Sig Start Date End Date Taking? Authorizing Provider  brimonidine (ALPHAGAN) 0.15 % ophthalmic solution Place 1 drop into both eyes 2 (two) times daily. 11/05/19   [provider]  diclofenac Sodium (VOLTAREN) 1 % GEL Apply 1 application topically 2 (two) times daily as needed for pain. 11/05/19   [provider]  dorzolamide-timolol (COSOPT) 22.3-6.8 MG/ML ophthalmic solution Place 1 drop into both eyes 2 (two) times daily. 04/25/15   [provider]  ferrous sulfate 325 (65 FE) MG EC tablet Take 325 mg by mouth daily with breakfast.    [provider]  furosemide (LASIX) 40 MG tablet Take 40 mg by mouth daily as needed for fluid or edema.  03/14/15   [provider]  latanoprost (XALATAN) 0.005 % ophthalmic solution Place 1 drop into both eyes at bedtime. 11/05/19   [provider]  LEVEMIR FLEXTOUCH 100 UNIT/ML Pen Inject 40 Units into the skin 2 (two) times daily. 03/18/15   [provider]  losartan (COZAAR)  25 MG tablet Take 25 mg by mouth daily.    [provider]  metFORMIN (GLUCOPHAGE) 1000 MG tablet Take 1,000 mg by mouth 2 (two) times daily. 10/17/19   [provider]  metoprolol (LOPRESSOR) 100 MG tablet Take 100 mg by mouth 2 (two) times daily.    [provider]  Multiple Vitamin (MULTIVITAMIN WITH MINERALS) TABS tablet Take 1 tablet by mouth daily.    [provider]  triamcinolone ointment (KENALOG) 0.1 % Apply 1 application topically 3 (three) times daily as needed (Skin rash).  02/23/15   [provider]      Allergies    Patient has no known allergies.    Review of Systems   Review of Systems  Physical Exam Updated Vital Signs BP (!) 156/85 (BP Location: Right Arm)   Pulse 89   Temp 98.3 F (36.8 C)   Resp 16   SpO2 100%  Physical Exam Vitals reviewed.  Constitutional:      Appearance: Normal appearance.  HENT:     Head: Normocephalic.     Right Ear: External ear normal.     Left Ear: External ear normal.     Nose: Nose normal.     Mouth/Throat:     Pharynx: Oropharynx is clear.  Eyes:     Pupils: Pupils are equal, round, and reactive to light.  Cardiovascular:     Rate and Rhythm: Normal rate and regular rhythm.  Pulses: Normal pulses.  Pulmonary:     Effort: Pulmonary effort is normal.     Breath sounds: Normal breath sounds.  Abdominal:     General: Abdomen is flat.     Palpations: Abdomen is soft.  Musculoskeletal:     Cervical back: Normal range of motion.     Comments: Right shoulder visually inspected no signs of trauma.  There is some tenderness with ranging but full active range of motion.  No warmth or redness noted  Skin:    General: Skin is warm and dry.     Capillary Refill: Capillary refill takes less than 2 seconds.  Neurological:     General: No focal deficit present.     Mental Status: She is alert.  Psychiatric:        Mood and Affect: Mood normal.     ED Results / Procedures /  Treatments   Labs (all labs ordered are listed, but only abnormal results are displayed) Labs Reviewed  BASIC METABOLIC PANEL WITH GFR - Abnormal; Notable for the following components:      Result Value   Glucose, Bld 138 (*)    BUN 24 (*)    Creatinine, Ser 1.41 (*)    GFR, Estimated 37 (*)    All other components within normal limits  CBC    EKG EKG Interpretation Date/Time:  Sunday August 21 2023 13:23:37 EDT Ventricular Rate:  85 PR Interval:  180 QRS Duration:  70 QT Interval:  354 QTC Calculation: 421 R Axis:   1  Text Interpretation: Normal sinus rhythm Minimal voltage criteria for LVH, may be normal variant ( R in aVL ) Cannot rule out Anterior infarct , age undetermined Abnormal ECG No significant change since last tracing Confirmed by Margarita Grizzle [phone]) on 08/21/2023 2:53:14 PM  Radiology No results found.  Procedures Procedures    Medications Ordered in ED Medications - No data to display  ED Course/ Medical Decision Making/ A&P Clinical Course as of 08/21/23 1454  Sun Aug 21, 2023  1452 Basic metabolic panel reviewed interpreted creatinine slightly elevated 1.41 with first prior of 1.2 CBC reviewed interpreted and normal [DR]    Clinical Course User Index [DR] Margarita Grizzle, MD                                 Medical Decision Making Amount and/or Complexity of Data Reviewed Labs: ordered.   83 year old female history of hypertension noncompliant with medications for 3 days presents with some hypertension.  Blood pressure has now decreased to 156/85 which she reports as being normal.  She has not taken her blood pressure medications yet.  Patient advised to restart medications and follow-up with primary care doctor.  She is advised to return if any new or worsening symptoms        Final Clinical Impression(s) / ED Diagnoses Final diagnoses:  Hypertension, unspecified type    Rx / DC Orders ED Discharge Orders     None         Margarita Grizzle, MD 08/21/23 1454

## 2023-11-19 ENCOUNTER — Ambulatory Visit (HOSPITAL_COMMUNITY)
Admission: EM | Admit: 2023-11-19 | Discharge: 2023-11-19 | Disposition: A | Discharge status: Home / Self Care | Payer: Medicare (Managed Care)

## 2023-11-19 ENCOUNTER — Encounter (HOSPITAL_COMMUNITY): Payer: Self-pay

## 2023-11-19 DIAGNOSIS — R519 Headache, unspecified: Secondary | ICD-10-CM | POA: Diagnosis not present

## 2023-11-19 NOTE — ED Provider Notes (Signed)
 [facility]    CSN: [id number] Arrival date & time: 11/19/23  1411      History   Chief Complaint Chief Complaint  Patient presents with   Headache    HPI Christy Hanson is a 83 y.o. female.   Patient complains of headaches that are occurring only in the morning.  Resolves throughout the day.  She reports she does not have a headache at this time.  She denies associated nausea, vomiting, visual disturbances.  Denies weakness or trouble with balance.  She denies any new medications.  Reports she is drinking plenty of fluids.  She has been without her CPAP machine for the last week as she was waiting for a part.  Reports she has not been sleeping well.  BP slightly elevated here in clinic today.  She has not been able to check this at home.  Patient is taking Tylenol  with minimal relief of headache in the mornings.  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   Exertional shortness of breath 07/24/2019   Impairment of balance    Malignant essential hypertension 07/24/2019   Obstructive sleep apnea    Osteoarthritis of multiple joints    Pure hypercholesterolemia    Thyroid disease    Visual hallucinations    Hx of Carlin Abrahams syndrome per eye doctor    Patient Active Problem List   Diagnosis Date Noted   Pain due to onychomycosis of toenails of both feet 10/12/2021   Thyroid disease 06/22/2021   Acute congestive heart failure    Daytime somnolence    Decreased estrogen level    Impairment of balance     Morbid obesity    Obstructive sleep apnea    Osteoarthritis of multiple joints    Pure hypercholesterolemia    Visual hallucinations    DM (diabetes mellitus), 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      Home Medications    Prior to Admission medications   Medication Sig Start Date End Date Taking? Authorizing Provider  brimonidine  (ALPHAGAN ) 0.15 % ophthalmic solution Place 1 drop into both eyes 2 (two) times daily. 11/05/19  Yes [provider]  diclofenac  Sodium (VOLTAREN ) 1 % GEL Apply 1 application topically 2 (two) times daily as needed for pain. 11/05/19  Yes [provider]  dorzolamide -timolol  (COSOPT ) 22.3-6.8 MG/ML ophthalmic solution Place 1 drop into both eyes 2 (two) times daily. 04/25/15  Yes [provider]  ferrous sulfate  325 (65 FE) MG EC tablet Take 325 mg by mouth daily with breakfast.   Yes [provider]  furosemide (LASIX) 40  MG tablet Take 40 mg by mouth daily as needed for fluid or edema.  03/14/15  Yes [provider]  JARDIANCE 10 MG TABS tablet Take 10 mg by mouth daily.   Yes [provider]  latanoprost  (XALATAN ) 0.005 % ophthalmic solution Place 1 drop into both eyes at bedtime. 11/05/19  Yes [provider]  LEVEMIR  FLEXTOUCH 100 UNIT/ML Pen Inject 40 Units into the skin 2 (two) times daily. 03/18/15  Yes [provider]  losartan  (COZAAR ) 25 MG tablet Take 25 mg by mouth daily.   Yes [provider]  metFORMIN  (GLUCOPHAGE ) 1000 MG tablet Take 1,000 mg by mouth 2 (two) times daily. 10/17/19  Yes [provider]  metoprolol  (LOPRESSOR ) 100 MG tablet Take 100 mg by mouth 2 (two) times daily.   Yes [provider]  Multiple Vitamin (MULTIVITAMIN WITH MINERALS) TABS tablet Take 1 tablet by mouth daily.   Yes [provider]  triamcinolone ointment (KENALOG) 0.1 % Apply 1 application topically 3 (three) times daily as needed (Skin rash).  02/23/15  Yes [provider]    Family History Family History  Problem Relation Age of Onset   Diabetes Mellitus II Brother     Social History Social History   Tobacco Use   Smoking status: Never   Smokeless tobacco: Never  Vaping Use   Vaping status: Never Used  Substance Use Topics   Alcohol use: No   Drug use: No     Allergies   Patient has no known allergies.   Review of Systems Review of Systems  Constitutional:  Negative for chills and fever.  HENT:  Negative for ear pain and sore throat.   Eyes:  Negative for pain and visual disturbance.  Respiratory:  Negative for cough and shortness of breath.   Cardiovascular:  Negative for chest pain and palpitations.  Gastrointestinal:  Negative for abdominal pain and vomiting.  Genitourinary:  Negative for dysuria and hematuria.  Musculoskeletal:  Negative for arthralgias and back pain.  Skin:  Negative for color change and rash.  Neurological:  Positive for headaches. Negative for seizures and syncope.  All other systems reviewed and are negative.    Physical Exam Triage Vital Signs ED Triage Vitals  Encounter Vitals Group     BP 11/19/23 1435 (!) 193/74     Girls Systolic BP Percentile --      Girls Diastolic BP Percentile --      Boys Systolic BP Percentile --      Boys Diastolic BP Percentile --      Pulse Rate 11/19/23 1435 (!) 53     Resp 11/19/23 1435 16     Temp 11/19/23 1435 97.8 F (36.6 C)     Temp Source 11/19/23 1435 Oral     SpO2 11/19/23 1435 96 %     Weight 11/19/23 1433 210 lb (95.3 kg)     Height 11/19/23 1433 5' 2 (1.575 m)      Head Circumference --      Peak Flow --      Pain Score 11/19/23 1432 4     Pain Loc --      Pain Education --      Exclude from Growth Chart --    No data found.  Updated Vital Signs BP (!) 193/74 (BP Location: Right Arm)   Pulse (!) 53   Temp 97.8 F (36.6 C) (Oral)   Resp 16   Ht 5' 2 (1.575 m)  Wt 210 lb (95.3 kg)   SpO2 96%   BMI 38.41 kg/m   Visual Acuity Right Eye Distance:   Left Eye Distance:   Bilateral Distance:    Right Eye Near:   Left Eye Near:    Bilateral Near:     Physical Exam Vitals and nursing note reviewed.  Constitutional:      General: She is not in acute distress.    Appearance: She is well-developed.  HENT:     Head: Normocephalic and atraumatic.   Eyes:     Conjunctiva/sclera: Conjunctivae normal.    Cardiovascular:     Rate and Rhythm: Normal rate and regular rhythm.     Heart sounds: No murmur heard. Pulmonary:     Effort: Pulmonary effort is normal. No respiratory distress.     Breath sounds: Normal breath sounds.  Abdominal:     Palpations: Abdomen is soft.     Tenderness: There is no abdominal tenderness.   Musculoskeletal:        General: No swelling.     Cervical back: Neck supple.   Skin:    General: Skin is warm and dry.     Capillary Refill: Capillary refill takes less than 2 seconds.   Neurological:     Mental Status: She is alert.   Psychiatric:        Mood and Affect: Mood normal.      UC Treatments / Results  Labs (all labs ordered are listed, but only abnormal results are displayed) Labs Reviewed - No data to display  EKG   Radiology No results found.  Procedures Procedures (including critical care time)  Medications Ordered in UC Medications - No data to display  Initial Impression / Assessment and Plan / UC Course  I have reviewed the triage vital signs and the nursing notes.  Pertinent labs & imaging results that were available during my care of the patient were reviewed by me  and considered in my medical decision making (see chart for details).     Patient with headaches that are normally occurring in the mornings, resolved throughout the day.  No concerning symptoms in the clinic today.  Normal neuro exam.  No weakness, visual disturbances.  Possibly related to being without CPAP as they are only occurring in the mornings.  Advised to follow-up with PCP this week. ED precautions given. Final Clinical Impressions(s) / UC Diagnoses   Final diagnoses:  Acute nonintractable headache, unspecified headache type     Discharge Instructions      Can continue with Tylenol  as needed.  Make sure you are drinking plenty of fluids If you are having more concerning symptoms like weakness, visual changes, slurred speech please go to the emergency department immediately. Recommend working on getting CPAP back up and running.  If headaches persist after resuming CPAP please make an appointment to follow-up with your primary care physician.     ED Prescriptions   None    PDMP not reviewed this encounter.   Ward, Harlene PEDLAR, PA-C 11/19/23 1511

## 2023-11-19 NOTE — ED Triage Notes (Signed)
 Pt states that she has had a headache x2-3 days  Pt states that she has a headache first thing in the mornings.

## 2023-11-19 NOTE — Discharge Instructions (Signed)
 Can continue with Tylenol  as needed.  Make sure you are drinking plenty of fluids If you are having more concerning symptoms like weakness, visual changes, slurred speech please go to the emergency department immediately. Recommend working on getting CPAP back up and running.  If headaches persist after resuming CPAP please make an appointment to follow-up with your primary care physician.

## 2023-11-19 NOTE — ED Triage Notes (Signed)
 Pt had Tylenol  around 12:00 pm today.
